# Patient Record
Sex: Female | Born: 1960 | Race: White | Hispanic: No | Marital: Single
Health system: Midwestern US, Community
[De-identification: ages and names within clinical notes are randomized; demographics above are authoritative.]

## PROBLEM LIST (undated history)

## (undated) DIAGNOSIS — N289 Disorder of kidney and ureter, unspecified: Secondary | ICD-10-CM

## (undated) DIAGNOSIS — M797 Fibromyalgia: Secondary | ICD-10-CM

## (undated) DIAGNOSIS — M810 Age-related osteoporosis without current pathological fracture: Secondary | ICD-10-CM

## (undated) DIAGNOSIS — F419 Anxiety disorder, unspecified: Secondary | ICD-10-CM

## (undated) DIAGNOSIS — N3010 Interstitial cystitis (chronic) without hematuria: Secondary | ICD-10-CM

## (undated) DIAGNOSIS — M549 Dorsalgia, unspecified: Secondary | ICD-10-CM

## (undated) DIAGNOSIS — G8929 Other chronic pain: Secondary | ICD-10-CM

## (undated) DIAGNOSIS — C801 Malignant (primary) neoplasm, unspecified: Secondary | ICD-10-CM

## (undated) DIAGNOSIS — K449 Diaphragmatic hernia without obstruction or gangrene: Secondary | ICD-10-CM

## (undated) DIAGNOSIS — G629 Polyneuropathy, unspecified: Secondary | ICD-10-CM

## (undated) HISTORY — PX: TRACHEOSTOMY: SUR1362

## (undated) HISTORY — PX: KNEE SURGERY: SHX244

## (undated) HISTORY — PX: TONSILLECTOMY: SUR1361

## (undated) HISTORY — PX: CHOLECYSTECTOMY: SHX55

## (undated) HISTORY — PX: TUBAL LIGATION: SHX77

## (undated) HISTORY — PX: ABDOMINAL HYSTERECTOMY: SHX81

## (undated) HISTORY — PX: APPENDECTOMY: SHX54

---

## 2010-06-30 LAB — MICROSCOPIC URINALYSIS: RBC, UA: 5 /HPF (ref 0–2)

## 2010-06-30 LAB — URINALYSIS
Bilirubin, Urine: NEGATIVE
Glucose, UA: NEGATIVE
Ketones, Urine: NEGATIVE
Nitrite, Urine: POSITIVE — AB
Specific Gravity, UA: 1.018 (ref 1.005–1.030)
Urobilinogen, Urine: NORMAL
pH, UA: 6 (ref 5.0–8.0)

## 2010-06-30 LAB — URINE CULTURE CLEAN CATCH

## 2010-08-01 LAB — C. TRACHOMATIS / N. GONORRHOEAE, DNA

## 2010-08-01 LAB — MICROSCOPIC URINALYSIS
RBC, UA: 0 /HPF (ref 0–2)
WBC, UA: 0 /HPF (ref 0–5)

## 2010-08-01 LAB — CBC WITH DIFFERENTIAL
Absolute Baso #: 0.1 10*3/uL (ref 0.0–0.2)
Absolute Eos #: 1 10*3/uL — ABNORMAL HIGH (ref 0.0–0.4)
Absolute Lymph #: 3.7 10*3/uL (ref 1.0–4.8)
Absolute Mono #: 0.5 10*3/uL (ref 0.1–1.2)
Absolute Neut #: 4.2 10*3/uL (ref 1.8–7.7)
Basophils: 1 % (ref 0–2)
Eosinophils %: 11 % — ABNORMAL HIGH (ref 1–4)
Hematocrit: 40.1 % (ref 36–46)
Hemoglobin: 13.9 g/dL (ref 12.0–16.0)
Lymphocytes: 39 % (ref 24–44)
MCH: 33 pg (ref 26–34)
MCHC: 34.6 g/dL (ref 31–37)
MCV: 95.6 fL (ref 80–100)
MPV: 8.1 fL (ref 6.0–12.0)
Monocytes: 5 % (ref 2–11)
Platelet Count: 272 10*3/uL (ref 140–450)
RBC: 4.2 m/uL (ref 4.0–5.2)
RDW: 14.6 % (ref 12.5–15.4)
Seg Neutrophils: 44 % (ref 36–66)
WBC: 9.5 10*3/uL (ref 3.5–11.0)

## 2010-08-01 LAB — LIPASE: Lipase: 25 U/L (ref 5.6–51.3)

## 2010-08-01 LAB — HEPATIC FUNCTION PANEL
ALT: 24 U/L (ref 4–40)
AST: 32 U/L (ref 8–36)
Albumin: 4.4 g/dL (ref 3.4–4.8)
Alkaline Phosphatase: 58 U/L (ref 25–100)
Bilirubin, Direct: 0.09 mg/dL (ref 0.0–0.3)
Bilirubin, Indirect: 0.11 mg/dL (ref 0.0–1.0)
Protein, Total: 7 g/dL (ref 6.4–8.3)
Total Bilirubin: 0.2 mg/dL — ABNORMAL LOW (ref 0.3–1.2)

## 2010-08-01 LAB — VAGINITIS DNA PROBE
Direct Exam: NEGATIVE
Direct Exam: NEGATIVE
Direct Exam: POSITIVE — AB

## 2010-08-01 LAB — BASIC METABOLIC PANEL
Anion Gap: 10 mmol/L (ref 8–16)
BUN: 16 mg/dL (ref 6–20)
CO2: 28 mmol/L (ref 20–31)
Calcium: 9.7 mg/dL (ref 8.6–10.4)
Chloride: 109 mmol/L (ref 98–110)
Creatinine: 0.71 mg/dL (ref 0.4–1.0)
GFR African American: >60 mL/min (ref >60)
GFR Non-African American: >60 mL/min (ref >60)
Glucose: 95 mg/dL (ref 74–106)
Potassium: 4.8 mmol/L (ref 3.5–5.1)
Sodium: 142 mmol/L (ref 136–145)

## 2010-08-01 LAB — APTT: PTT: 26.6 s (ref 20.3–31.1)

## 2010-08-01 LAB — DRUGS OF ABUSE SCR, URINE
Amphetamine Screen, Ur: NEGATIVE
Barbiturate Screen, Ur: NEGATIVE
Benzodiazepines: POSITIVE — AB
Cannabinoid Scrn, Ur: NEGATIVE
Cocaine Metabolite, Urine: POSITIVE — AB
Methadone Screen, Urine: NEGATIVE
Opiates, Urine: NEGATIVE
Phencyclidine, Urine: NEGATIVE
Propoxyphene, Urine: NEGATIVE

## 2010-08-01 LAB — URINALYSIS
Bilirubin, Urine: NEGATIVE
Glucose, UA: NEGATIVE
Ketones, Urine: NEGATIVE
Nitrite, Urine: NEGATIVE
Protein, UA: NEGATIVE
Specific Gravity, UA: 1.024 (ref 1.005–1.030)
Urine Hgb: NEGATIVE
Urobilinogen, Urine: NORMAL
pH, UA: 6 (ref 5.0–8.0)

## 2010-08-01 LAB — URINE CULTURE CLEAN CATCH

## 2010-08-01 LAB — PROTIME-INR
INR: 0.9
Prothrombin Time: 10.1 s (ref 9.6–12.2)

## 2010-08-01 LAB — AMYLASE: Amylase: 43 U/L (ref 20–104)

## 2010-08-01 LAB — T. PALLIDUM AB: T. pallidum, IgG: NONREACTIVE

## 2010-08-13 LAB — HEPATIC FUNCTION PANEL
ALT: 39 U/L (ref 4–40)
AST: 36 U/L (ref 8–36)
Albumin: 4.3 g/dL (ref 3.4–4.8)
Alkaline Phosphatase: 78 U/L (ref 25–100)
Bilirubin, Direct: 0.08 mg/dL (ref 0.0–0.3)
Bilirubin, Indirect: 0.17 mg/dL (ref 0.0–1.0)
Protein, Total: 7 g/dL (ref 6.4–8.3)
Total Bilirubin: 0.25 mg/dL — ABNORMAL LOW (ref 0.3–1.2)

## 2010-08-13 LAB — CHEST PAIN PNL (HR0)
% CKMB: 1 % (ref 0–4)
Absolute Baso #: 0.1 10*3/uL (ref 0.0–0.2)
Absolute Eos #: 0 10*3/uL (ref 0.0–0.4)
Absolute Lymph #: 1.4 10*3/uL (ref 1.0–4.8)
Absolute Mono #: 0.5 10*3/uL (ref 0.1–1.2)
Absolute Neut #: 7.7 10*3/uL (ref 1.8–7.7)
Anion Gap: 14 mmol/L (ref 8–16)
BUN: 16 mg/dL (ref 6–20)
Basophils: 1 % (ref 0–2)
CK-MB: 1.7 ng/mL (ref 0–5)
CO2: 28 mmol/L (ref 20–31)
Calcium: 9.6 mg/dL (ref 8.6–10.4)
Chloride: 103 mmol/L (ref 98–110)
Creatinine: 0.69 mg/dL (ref 0.4–1.0)
Eosinophils %: 0 % — ABNORMAL LOW (ref 1–4)
GFR African American: >60 mL/min (ref >60)
GFR Non-African American: >60 mL/min (ref >60)
Glucose: 116 mg/dL — ABNORMAL HIGH (ref 74–106)
Hematocrit: 37.8 % (ref 36–46)
Hemoglobin: 12.9 g/dL (ref 12.0–16.0)
Lymphocytes: 15 % — ABNORMAL LOW (ref 24–44)
MCH: 32.4 pg (ref 26–34)
MCHC: 34 g/dL (ref 31–37)
MCV: 95.3 fL (ref 80–100)
MPV: 7.9 fL (ref 6.0–12.0)
Monocytes: 5 % (ref 2–11)
Myoglobin: 30 ng/mL (ref 0–110)
Platelet Count: 239 10*3/uL (ref 140–450)
Potassium: 3.7 mmol/L (ref 3.5–5.1)
RBC: 3.96 m/uL — ABNORMAL LOW (ref 4.0–5.2)
RDW: 14.6 % (ref 12.5–15.4)
Seg Neutrophils: 79 % — ABNORMAL HIGH (ref 36–66)
Sodium: 141 mmol/L (ref 136–145)
Total CK: 162 U/L — ABNORMAL HIGH (ref 26–140)
Troponin I: 0.02 ng/mL (ref 0.00–0.04)
WBC: 9.8 10*3/uL (ref 3.5–11.0)

## 2010-08-13 LAB — TROPONIN
Troponin I: 0.02 ng/mL (ref 0.00–0.04)
Troponin I: 0.02 ng/mL (ref 0.00–0.04)

## 2010-08-13 LAB — LIPASE: Lipase: 19 U/L (ref 5.6–51.3)

## 2010-08-13 LAB — MYOGLOBIN, SERUM: Myoglobin: 33 ng/mL (ref 0–110)

## 2010-08-13 LAB — D-DIMER, QUANTITATIVE: D-Dimer, Quant: 1.03 mg/L FEU

## 2010-08-14 LAB — TROP/MYOGLOBIN
Myoglobin: 22 ng/mL (ref 0–110)
Troponin I: 0.01 ng/mL (ref 0.00–0.04)

## 2010-08-14 LAB — POC GLUCOSE FINGERSTICK
Glucose, Whole Blood: 105 mg/dL (ref 65–105)
Glucose, Whole Blood: 93 mg/dL (ref 65–105)
Glucose, Whole Blood: 118 mg/dL — ABNORMAL HIGH (ref 65–105)

## 2011-12-26 ENCOUNTER — Encounter (HOSPITAL_BASED_OUTPATIENT_CLINIC_OR_DEPARTMENT_OTHER): Payer: Self-pay | Admitting: *Deleted

## 2011-12-26 ENCOUNTER — Emergency Department (HOSPITAL_BASED_OUTPATIENT_CLINIC_OR_DEPARTMENT_OTHER)
Admission: EM | Admit: 2011-12-26 | Discharge: 2011-12-26 | Disposition: A | Discharge status: Home / Self Care | Payer: Medicare Other | Attending: Emergency Medicine | Admitting: Emergency Medicine

## 2011-12-26 DIAGNOSIS — M546 Pain in thoracic spine: Secondary | ICD-10-CM | POA: Insufficient documentation

## 2011-12-26 DIAGNOSIS — F1120 Opioid dependence, uncomplicated: Secondary | ICD-10-CM | POA: Insufficient documentation

## 2011-12-26 DIAGNOSIS — F17200 Nicotine dependence, unspecified, uncomplicated: Secondary | ICD-10-CM | POA: Insufficient documentation

## 2011-12-26 DIAGNOSIS — M069 Rheumatoid arthritis, unspecified: Secondary | ICD-10-CM | POA: Insufficient documentation

## 2011-12-26 DIAGNOSIS — M545 Low back pain, unspecified: Secondary | ICD-10-CM | POA: Insufficient documentation

## 2011-12-26 DIAGNOSIS — M79609 Pain in unspecified limb: Secondary | ICD-10-CM | POA: Insufficient documentation

## 2011-12-26 DIAGNOSIS — E119 Type 2 diabetes mellitus without complications: Secondary | ICD-10-CM | POA: Insufficient documentation

## 2011-12-26 HISTORY — DX: Malignant (primary) neoplasm, unspecified: C80.1

## 2011-12-26 HISTORY — DX: Anxiety disorder, unspecified: F41.9

## 2011-12-26 HISTORY — DX: Disorder of kidney and ureter, unspecified: N28.9

## 2011-12-26 MED ORDER — LORAZEPAM 1 MG PO TABS
1.0000 mg | ORAL_TABLET | Freq: Once | ORAL | Status: AC
  Administered 2011-12-26: 1 mg via ORAL
  Filled 2011-12-26: qty 1

## 2011-12-26 MED ORDER — HYDROMORPHONE HCL PF 2 MG/ML IJ SOLN
2.0000 mg | Freq: Once | INTRAMUSCULAR | Status: AC
  Administered 2011-12-26: 2 mg via INTRAMUSCULAR
  Filled 2011-12-26: qty 1

## 2011-12-26 MED ORDER — ONDANSETRON HCL 4 MG/2ML IJ SOLN: 4.0000 mg | Freq: Once | INTRAMUSCULAR | Status: DC

## 2011-12-26 NOTE — ED Notes (Signed)
Back and leg pain chronic has been on high dose pain meds recently moved here

## 2011-12-26 NOTE — ED Provider Notes (Signed)
History     CSN: [id number]  Arrival date & time 12/26/11  1058   First MD Initiated Contact with Patient 12/26/11 1227      Chief Complaint  Patient presents with  . Back Pain  . Leg Pain    (Consider location/radiation/quality/duration/timing/severity/associated sxs/prior treatment) Patient is a 52 y.o. female presenting with back pain. The history is provided by the patient. No language interpreter was used.  Back Pain  This is a chronic problem. Episode onset: 10 years. The problem occurs constantly. The problem has been gradually worsening. The pain is associated with no known injury. The pain is present in the lumbar spine and thoracic spine. The quality of the pain is described as aching. The pain is at a severity of 9/10. The pain is severe. The symptoms are aggravated by bending and twisting. The pain is the same all the time. Stiffness is present all day. She has tried nothing for the symptoms.  Pt complains of chronic pain. Pt reports she has been on oxy 30 and dilaudid 2 times a day.  Pt has been on multiple pain medications for the past 10 years. Pt saw Dr. Edmon Crape and is trying to get into pain management. And the methadone clinic.  Past Medical History  Diagnosis Date  . Arthritis   . Anxiety   . Renal disorder   . Rheumatoid arthritis   . Diabetes mellitus   . Cancer     History reviewed. No pertinent past surgical history.  History reviewed. No pertinent family history.  History  Substance Use Topics  . Smoking status: Current Everyday Smoker  . Smokeless tobacco: Not on file  . Alcohol Use: Yes     occ    OB History    Grav Para Term Preterm Abortions TAB SAB Ect Mult Living                  Review of Systems  Musculoskeletal: Positive for back pain.  All other systems reviewed and are negative.    Allergies  Metformin and related; Seroquel; Trazodone and nefazodone; and Zofran  Home Medications  No current outpatient prescriptions on  file.  BP 125/96  Pulse 92  Temp(Src) 98 F (36.7 C) (Oral)  Resp 20  SpO2 99%  Physical Exam  Vitals reviewed. Constitutional: She is oriented to person, place, and time. She appears well-developed and well-nourished.  HENT:  Head: Normocephalic and atraumatic.  Eyes: Conjunctivae are normal. Pupils are equal, round, and reactive to light.  Neck: Normal range of motion. Neck supple.  Cardiovascular: Normal rate.   Pulmonary/Chest: Effort normal.  Abdominal: Soft.  Musculoskeletal: Normal range of motion.  Neurological: She is alert and oriented to person, place, and time. She has normal reflexes.  Skin: Skin is warm.  Psychiatric: She has a normal mood and affect.    ED Course  Procedures (including critical care time)  Labs Reviewed - No data to display No results found.   No diagnosis found.    MDM  Pt is not showing any sign of withdrawal.  I advised her this/ED will not manage the kind of medications she request.         Elson Areas, PA 12/26/11 1303

## 2011-12-26 NOTE — Discharge Instructions (Signed)

## 2011-12-26 NOTE — ED Provider Notes (Signed)
Medical screening examination/treatment/procedure(s) were performed by non-physician practitioner and as supervising physician I was immediately available for consultation/collaboration.   Sidda Humm, MD 12/26/11 1515 

## 2012-03-27 ENCOUNTER — Emergency Department (HOSPITAL_BASED_OUTPATIENT_CLINIC_OR_DEPARTMENT_OTHER)
Admission: EM | Admit: 2012-03-27 | Discharge: 2012-03-27 | Disposition: A | Discharge status: Home / Self Care | Payer: Medicare Other | Attending: Emergency Medicine | Admitting: Emergency Medicine

## 2012-03-27 ENCOUNTER — Encounter (HOSPITAL_BASED_OUTPATIENT_CLINIC_OR_DEPARTMENT_OTHER): Payer: Self-pay | Admitting: Family Medicine

## 2012-03-27 DIAGNOSIS — E119 Type 2 diabetes mellitus without complications: Secondary | ICD-10-CM | POA: Insufficient documentation

## 2012-03-27 DIAGNOSIS — Z90710 Acquired absence of both cervix and uterus: Secondary | ICD-10-CM | POA: Insufficient documentation

## 2012-03-27 DIAGNOSIS — F17200 Nicotine dependence, unspecified, uncomplicated: Secondary | ICD-10-CM | POA: Insufficient documentation

## 2012-03-27 DIAGNOSIS — M129 Arthropathy, unspecified: Secondary | ICD-10-CM | POA: Insufficient documentation

## 2012-03-27 DIAGNOSIS — IMO0001 Reserved for inherently not codable concepts without codable children: Secondary | ICD-10-CM | POA: Insufficient documentation

## 2012-03-27 DIAGNOSIS — M069 Rheumatoid arthritis, unspecified: Secondary | ICD-10-CM | POA: Insufficient documentation

## 2012-03-27 DIAGNOSIS — Z9089 Acquired absence of other organs: Secondary | ICD-10-CM | POA: Insufficient documentation

## 2012-03-27 DIAGNOSIS — G589 Mononeuropathy, unspecified: Secondary | ICD-10-CM | POA: Insufficient documentation

## 2012-03-27 DIAGNOSIS — F411 Generalized anxiety disorder: Secondary | ICD-10-CM | POA: Insufficient documentation

## 2012-03-27 DIAGNOSIS — R51 Headache: Secondary | ICD-10-CM | POA: Insufficient documentation

## 2012-03-27 HISTORY — DX: Fibromyalgia: M79.7

## 2012-03-27 HISTORY — DX: Polyneuropathy, unspecified: G62.9

## 2012-03-27 HISTORY — DX: Diaphragmatic hernia without obstruction or gangrene: K44.9

## 2012-03-27 MED ORDER — PROMETHAZINE HCL 25 MG RE SUPP
25.0000 mg | Freq: Four times a day (QID) | RECTAL | Status: DC | PRN
  Filled 2012-03-27: qty 1

## 2012-03-27 MED ORDER — PROMETHAZINE HCL 25 MG/ML IJ SOLN
25.0000 mg | Freq: Once | INTRAMUSCULAR | Status: AC
  Administered 2012-03-27: 25 mg via INTRAMUSCULAR
  Filled 2012-03-27: qty 1

## 2012-03-27 MED ORDER — HYDROMORPHONE HCL PF 2 MG/ML IJ SOLN
2.0000 mg | Freq: Once | INTRAMUSCULAR | Status: AC
  Administered 2012-03-27: 2 mg via INTRAMUSCULAR
  Filled 2012-03-27: qty 1

## 2012-03-27 NOTE — ED Notes (Signed)
Pt c/o migraine with nausea x 3 days. Pt taking tylenol and benadryl and phenergan for same. Pt sts she has had migraines for "20 years".

## 2012-03-27 NOTE — ED Provider Notes (Signed)
History     CSN: [id number]  Arrival date & time 03/27/12  1238   First MD Initiated Contact with Patient 03/27/12 1252      Chief Complaint  Patient presents with  . Migraine    (Consider location/radiation/quality/duration/timing/severity/associated sxs/prior treatment) Patient is a 51 y.o. female presenting with headaches. The history is provided by the patient. No language interpreter was used.  Headache  This is a new problem. The current episode started more than 2 days ago. The problem occurs constantly. The headache is associated with nothing. The pain is located in the frontal region. The quality of the pain is described as sharp. The pain is at a severity of 7/10. The pain is severe. Associated symptoms include nausea. She has tried nothing for the symptoms.   Pt complains of a headache.   Pt request injection of dilaudid and phenergan.   Pt reports there area a lot of medicines she will not take.  Pt reports she can take oxycodone and dilaudid Past Medical History  Diagnosis Date  . Arthritis   . Anxiety   . Renal disorder   . Rheumatoid arthritis   . Diabetes mellitus   . Cancer   . Fibromyalgia   . Neuropathy   . Hiatal hernia     Past Surgical History  Procedure Date  . Tonsillectomy   . Abdominal hysterectomy   . Cholecystectomy   . Tubal ligation   . Tracheostomy     No family history on file.  History  Substance Use Topics  . Smoking status: Current Everyday Smoker  . Smokeless tobacco: Not on file  . Alcohol Use: Yes     occ    OB History    Grav Para Term Preterm Abortions TAB SAB Ect Mult Living                  Review of Systems  Gastrointestinal: Positive for nausea.  Neurological: Positive for headaches.  All other systems reviewed and are negative.    Allergies  Metformin and related; Reglan; Seroquel; Trazodone and nefazodone; Unasyn; and Zofran  Home Medications   Current Outpatient Rx  Name Route Sig Dispense Refill  .  ACETAMINOPHEN 325 MG PO TABS Oral Take 650 mg by mouth every 6 (six) hours as needed.    Marland Kitchen GABAPENTIN 600 MG PO TABS Oral Take 600 mg by mouth 3 (three) times daily.    Marland Kitchen LORAZEPAM 1 MG PO TABS Oral Take 1 mg by mouth every 8 (eight) hours as needed.    . EFFEXOR PO Oral Take 250 mg by mouth 2 (two) times daily.      BP 126/98  Pulse 112  Temp 98.3 F (36.8 C) (Oral)  Resp 18  Ht 5' (1.524 m)  Wt 162 lb (73.483 kg)  BMI 31.64 kg/m2  SpO2 100%  Physical Exam  Nursing note and vitals reviewed. Constitutional: She is oriented to person, place, and time. She appears well-developed and well-nourished.  HENT:  Head: Normocephalic and atraumatic.  Eyes: Conjunctivae and EOM are normal. Pupils are equal, round, and reactive to light.  Cardiovascular: Normal rate and normal heart sounds.   Pulmonary/Chest: Effort normal.  Abdominal: Soft.  Musculoskeletal: Normal range of motion.  Neurological: She is alert and oriented to person, place, and time.  Skin: Skin is warm.  Psychiatric: She has a normal mood and affect.    ED Course  Procedures (including critical care time)  Labs Reviewed - No data to display  No results found.   1. Headache       MDM  Dilaudid and phenergan IM        Lonia Skinner [place], PA 03/27/12 1347  Lonia Skinner [place], [place] 03/27/12 1359

## 2012-03-27 NOTE — ED Provider Notes (Signed)
Medical screening examination/treatment/procedure(s) were performed by non-physician practitioner and as supervising physician I was immediately available for consultation/collaboration.   Forbes Cellar, MD 03/27/12 [phone]

## 2012-06-04 ENCOUNTER — Emergency Department (HOSPITAL_BASED_OUTPATIENT_CLINIC_OR_DEPARTMENT_OTHER)
Admission: EM | Admit: 2012-06-04 | Discharge: 2012-06-04 | Disposition: A | Discharge status: Home / Self Care | Payer: Medicare Other | Attending: Emergency Medicine | Admitting: Emergency Medicine

## 2012-06-04 ENCOUNTER — Encounter (HOSPITAL_BASED_OUTPATIENT_CLINIC_OR_DEPARTMENT_OTHER): Payer: Self-pay

## 2012-06-04 ENCOUNTER — Emergency Department (HOSPITAL_BASED_OUTPATIENT_CLINIC_OR_DEPARTMENT_OTHER): Payer: Medicare Other

## 2012-06-04 DIAGNOSIS — N12 Tubulo-interstitial nephritis, not specified as acute or chronic: Secondary | ICD-10-CM

## 2012-06-04 DIAGNOSIS — E119 Type 2 diabetes mellitus without complications: Secondary | ICD-10-CM | POA: Insufficient documentation

## 2012-06-04 DIAGNOSIS — R509 Fever, unspecified: Secondary | ICD-10-CM | POA: Insufficient documentation

## 2012-06-04 DIAGNOSIS — R109 Unspecified abdominal pain: Secondary | ICD-10-CM | POA: Insufficient documentation

## 2012-06-04 DIAGNOSIS — Z79899 Other long term (current) drug therapy: Secondary | ICD-10-CM | POA: Insufficient documentation

## 2012-06-04 DIAGNOSIS — I2510 Atherosclerotic heart disease of native coronary artery without angina pectoris: Secondary | ICD-10-CM | POA: Insufficient documentation

## 2012-06-04 DIAGNOSIS — Z9089 Acquired absence of other organs: Secondary | ICD-10-CM | POA: Insufficient documentation

## 2012-06-04 HISTORY — DX: Age-related osteoporosis without current pathological fracture: M81.0

## 2012-06-04 LAB — COMPREHENSIVE METABOLIC PANEL
ALT: 26 U/L (ref 0–35)
AST: 18 U/L (ref 0–37)
Alkaline Phosphatase: 80 U/L (ref 39–117)
CO2: 26 mEq/L (ref 19–32)
Chloride: 99 mEq/L (ref 96–112)
GFR calc non Af Amer: >90 mL/min (ref >90)
Glucose, Bld: 116 mg/dL — ABNORMAL HIGH (ref 70–99)
Potassium: 4 mEq/L (ref 3.5–5.1)
Sodium: 136 mEq/L (ref 135–145)
Total Bilirubin: 0.3 mg/dL (ref 0.3–1.2)

## 2012-06-04 LAB — CBC WITH DIFFERENTIAL/PLATELET
Basophils Absolute: 0 10*3/uL (ref 0.0–0.1)
HCT: 39.5 % (ref 36.0–46.0)
Lymphocytes Relative: 14 % (ref 12–46)
Lymphs Abs: 1.8 10*3/uL (ref 0.7–4.0)
Neutro Abs: 9.4 10*3/uL — ABNORMAL HIGH (ref 1.7–7.7)
Platelets: 299 10*3/uL (ref 150–400)
RBC: 4.13 MIL/uL (ref 3.87–5.11)
RDW: 13.7 % (ref 11.5–15.5)
WBC: 12.4 10*3/uL — ABNORMAL HIGH (ref 4.0–10.5)

## 2012-06-04 LAB — URINALYSIS, ROUTINE W REFLEX MICROSCOPIC
Bilirubin Urine: NEGATIVE
Specific Gravity, Urine: 1.012 (ref 1.005–1.030)
Urobilinogen, UA: 1 mg/dL (ref 0.0–1.0)
pH: 6 (ref 5.0–8.0)

## 2012-06-04 LAB — URINE MICROSCOPIC-ADD ON

## 2012-06-04 MED ORDER — PROMETHAZINE HCL 25 MG PO TABS: 25.0000 mg | ORAL_TABLET | Freq: Four times a day (QID) | ORAL | Status: DC | PRN

## 2012-06-04 MED ORDER — SODIUM CHLORIDE 0.9 % IV BOLUS (SEPSIS)
1000.0000 mL | Freq: Once | INTRAVENOUS | Status: AC
  Administered 2012-06-04: 1000 mL via INTRAVENOUS

## 2012-06-04 MED ORDER — ACETAMINOPHEN 500 MG PO TABS
1000.0000 mg | ORAL_TABLET | Freq: Once | ORAL | Status: AC
  Administered 2012-06-04: 1000 mg via ORAL
  Filled 2012-06-04: qty 2

## 2012-06-04 MED ORDER — MORPHINE SULFATE 4 MG/ML IJ SOLN
INTRAMUSCULAR | Status: AC
  Filled 2012-06-04: qty 1

## 2012-06-04 MED ORDER — MORPHINE SULFATE 4 MG/ML IJ SOLN
4.0000 mg | Freq: Once | INTRAMUSCULAR | Status: AC
  Administered 2012-06-04: 4 mg via INTRAVENOUS
  Filled 2012-06-04: qty 1

## 2012-06-04 MED ORDER — PROMETHAZINE HCL 25 MG/ML IJ SOLN
25.0000 mg | Freq: Once | INTRAMUSCULAR | Status: AC
  Administered 2012-06-04: 25 mg via INTRAVENOUS
  Filled 2012-06-04: qty 1

## 2012-06-04 MED ORDER — OXYCODONE-ACETAMINOPHEN 5-325 MG PO TABS: 2.0000 | ORAL_TABLET | ORAL | Status: DC | PRN

## 2012-06-04 MED ORDER — DEXTROSE 5 % IV SOLN
1.0000 g | Freq: Once | INTRAVENOUS | Status: AC
  Administered 2012-06-04: 1 g via INTRAVENOUS
  Filled 2012-06-04: qty 10

## 2012-06-04 MED ORDER — CIPROFLOXACIN HCL 500 MG PO TABS: 500.0000 mg | ORAL_TABLET | Freq: Two times a day (BID) | ORAL | Status: DC

## 2012-06-04 NOTE — ED Provider Notes (Signed)
History     CSN: [id number]  Arrival date & time 06/04/12  1543   First MD Initiated Contact with Patient 06/04/12 1612      Chief Complaint  Patient presents with  . Urinary Frequency    (Consider location/radiation/quality/duration/timing/severity/associated sxs/prior treatment) HPI Comments: Patient presents with right flank pain for the past 3 days. The pain started gradually and is progressively worsening over the past 3 days. The pain is cramping and severe. She denies aggravating/alleviating factors. She reports a history of kidney stones and pyelonephritis which felt like this. She reports associated urinary frequency, nausea and fever. She denies dysuria, abdominal pain, hematuria, vomiting, diarrhea.    Past Medical History  Diagnosis Date  . Arthritis   . Anxiety   . Renal disorder   . Rheumatoid arthritis   . Diabetes mellitus   . Cancer   . Fibromyalgia   . Neuropathy   . Hiatal hernia   . Osteoporosis     Past Surgical History  Procedure Date  . Tonsillectomy   . Abdominal hysterectomy   . Cholecystectomy   . Tubal ligation   . Tracheostomy   . Cesarean section   . Knee surgery     No family history on file.  History  Substance Use Topics  . Smoking status: Current Every Day Smoker  . Smokeless tobacco: Not on file  . Alcohol Use: No    OB History    Grav Para Term Preterm Abortions TAB SAB Ect Mult Living                  Review of Systems  Constitutional: Positive for fever.  Gastrointestinal: Positive for nausea.  Genitourinary: Positive for flank pain.  All other systems reviewed and are negative.    Allergies  Aspirin; Ibuprofen; Metformin and related; Reglan; Seroquel; Trazodone and nefazodone; Unasyn; and Zofran  Home Medications   Current Outpatient Rx  Name Route Sig Dispense Refill  . XANAX PO Oral Take by mouth.    . ACETAMINOPHEN 325 MG PO TABS Oral Take 650 mg by mouth every 6 (six) hours as needed.    Marland Kitchen GABAPENTIN  600 MG PO TABS Oral Take 600 mg by mouth 3 (three) times daily.    Marland Kitchen LORAZEPAM 1 MG PO TABS Oral Take 1 mg by mouth every 8 (eight) hours as needed.    . EFFEXOR PO Oral Take 250 mg by mouth 2 (two) times daily.      BP 104/92  Pulse 114  Temp 99.9 F (37.7 C) (Oral)  Resp 16  SpO2 99%  Physical Exam  Nursing note and vitals reviewed. Constitutional: She is oriented to person, place, and time. She appears well-developed and well-nourished. No distress.  HENT:  Head: Normocephalic and atraumatic.  Eyes: Conjunctivae normal and EOM are normal. No scleral icterus.  Neck: Normal range of motion. Neck supple.  Cardiovascular: Normal rate and regular rhythm.  Exam reveals no gallop and no friction rub.   No murmur heard. Pulmonary/Chest: Effort normal and breath sounds normal. No respiratory distress. She has no wheezes. She has no rales. She exhibits no tenderness.  Abdominal: Soft. She exhibits no distension. There is no rebound and no guarding.       RUQ tenderness to palpation. No suprapubic tenderness noted.   Genitourinary:       CVA tenderness noted of right flank.   Musculoskeletal: Normal range of motion.  Neurological: She is alert and oriented to person, place, and time. Coordination  normal.  Skin: Skin is warm and dry. She is not diaphoretic.  Psychiatric: She has a normal mood and affect. Her behavior is normal.    ED Course  Procedures (including critical care time)   Date: 06/04/2012  Rate: 108  Rhythm: sinus tachycardia  QRS Axis: normal  Intervals: normal  ST/T Wave abnormalities: normal  Conduction Disutrbances:none  Narrative Interpretation: sinus tachycardia  Old EKG Reviewed: none available    Labs Reviewed  URINALYSIS, ROUTINE W REFLEX MICROSCOPIC - Abnormal; Notable for the following:    APPearance TURBID (*)     Hgb urine dipstick LARGE (*)     Protein, ur 30 (*)     Nitrite POSITIVE (*)     Leukocytes, UA LARGE (*)     All other components  within normal limits  URINE MICROSCOPIC-ADD ON - Abnormal; Notable for the following:    Bacteria, UA MANY (*)     All other components within normal limits  CBC WITH DIFFERENTIAL - Abnormal; Notable for the following:    WBC 12.4 (*)     Neutro Abs 9.4 (*)     All other components within normal limits  COMPREHENSIVE METABOLIC PANEL - Abnormal; Notable for the following:    Glucose, Bld 116 (*)     All other components within normal limits  URINE CULTURE   Ct Abdomen Pelvis Wo Contrast  06/04/2012  *RADIOLOGY REPORT*  Clinical Data:  Urinary frequency.  Pressure.  Fever.  Right flank pain for 3 days.  History of arthritis, anxiety, renal disorder, rheumatoid arthritis.  Diabetes.  Hiatal hernia.  Osteoporosis. Appendectomy.  CT ABDOMEN AND PELVIS WITHOUT CONTRAST (CT UROGRAM)  Technique: Contiguous axial images of the abdomen and pelvis without oral or intravenous contrast were obtained.  Comparison: None  Findings:  Exam is limited for evaluation of entities other than urinary tract calculi due to lack of oral or intravenous contrast.   Volume loss in the lingula.  Heart size upper normal, without pericardial or pleural effusion.  There is age advanced coronary artery atherosclerosis including on the right image 1.  Normal spleen.  A small hiatal hernia.  Normal stomach, pancreas.  The liver is enlarged at 21.2 cm cranial caudal.  Mild intrahepatic biliary ductal dilatation is identified.  Status post cholecystectomy.  The common duct measures 1.6 cm on image 26 in the porta hepatis, mildly dilated.  No obstructive stone or mass is identified.  Normal adrenal glands. No renal calculi or hydronephrosis.  There is equivocal right perirenal edema, including on image 34. The right ureter is minimally prominent relative to the left.  No stones identified. Small retroperitoneal nodes, without adenopathy.  Normal colon and terminal ileum.  Appendix surgically absent by history.  Normal small bowel without  abdominal ascites.  Age advanced aortic and branch vessel atherosclerosis.  No pelvic adenopathy.  Hysterectomy.  Normal urinary bladder.  No adnexal mass or significant free fluid.  Injection granulomas superficial the gluteal muscles bilaterally.  Mild osteopenia.  IMPRESSION:  1.  No urinary tract calculi.  Subtle right perirenal edema and ureteric prominence suspected.  This could represent prior stone passage or pyelonephritis. 2.  Cholecystectomy.  Mild biliary ductal dilatation.  Consider correlation with right upper quadrant symptoms and liver function tests.  If these are abnormal, non emergent MRCP should be considered. 3. Age advanced coronary artery atherosclerosis.  Recommend assessment of coronary risk factors and consideration of medical therapy.   Original Report Authenticated By: Consuello Bossier, M.D.  1. Pyelonephritis       MDM  8:52 PM Patient given 2L fluid, morphine, phenergan. She was treated for pyelonephritis with 1g Rocephin. Her CT scan shows no calculi. Labs show elevated WBC. Urinalysis shows infection.   Patient's pain and nausea is currently controlled. Repeat exam shows mild tenderness to palpation of RUQ. She is afebrile and heartrate has been reduced from 130's. I will discharge her with a 14 day course of Cipro, percocet for pain, and phenergan for nausea. Patient agrees to return to the ED with worsening or concerning symptoms.         Emilia Beck, PA-C 06/04/12 2120

## 2012-06-04 NOTE — ED Notes (Signed)
Pt given ice chips per kaitlyn, pa.

## 2012-06-04 NOTE — ED Notes (Signed)
C/o urinary freg, pressure, fever, right flank pain x 3 days

## 2012-06-05 NOTE — ED Provider Notes (Signed)
Medical screening examination/treatment/procedure(s) were performed by non-physician practitioner and as supervising physician I was immediately available for consultation/collaboration.  Derwood Kaplan, MD 06/05/12 [phone]

## 2012-06-06 LAB — URINE CULTURE

## 2012-06-07 NOTE — ED Notes (Signed)
+   Urine Patient treated with Cipro-sensitive to same-chart appended per protocol MD. 

## 2012-06-10 ENCOUNTER — Ambulatory Visit: Payer: Self-pay | Admitting: Pain Medicine

## 2012-07-22 ENCOUNTER — Emergency Department (HOSPITAL_BASED_OUTPATIENT_CLINIC_OR_DEPARTMENT_OTHER): Payer: Medicare Other

## 2012-07-22 ENCOUNTER — Encounter (HOSPITAL_BASED_OUTPATIENT_CLINIC_OR_DEPARTMENT_OTHER): Payer: Self-pay | Admitting: Family Medicine

## 2012-07-22 ENCOUNTER — Emergency Department (HOSPITAL_BASED_OUTPATIENT_CLINIC_OR_DEPARTMENT_OTHER)
Admission: EM | Admit: 2012-07-22 | Discharge: 2012-07-22 | Disposition: A | Discharge status: Home / Self Care | Payer: Medicare Other | Attending: Emergency Medicine | Admitting: Emergency Medicine

## 2012-07-22 DIAGNOSIS — K5900 Constipation, unspecified: Secondary | ICD-10-CM | POA: Insufficient documentation

## 2012-07-22 DIAGNOSIS — F411 Generalized anxiety disorder: Secondary | ICD-10-CM | POA: Insufficient documentation

## 2012-07-22 DIAGNOSIS — F17200 Nicotine dependence, unspecified, uncomplicated: Secondary | ICD-10-CM | POA: Insufficient documentation

## 2012-07-22 DIAGNOSIS — E119 Type 2 diabetes mellitus without complications: Secondary | ICD-10-CM | POA: Insufficient documentation

## 2012-07-22 DIAGNOSIS — Z79899 Other long term (current) drug therapy: Secondary | ICD-10-CM | POA: Insufficient documentation

## 2012-07-22 DIAGNOSIS — R109 Unspecified abdominal pain: Secondary | ICD-10-CM

## 2012-07-22 DIAGNOSIS — N390 Urinary tract infection, site not specified: Secondary | ICD-10-CM | POA: Insufficient documentation

## 2012-07-22 LAB — URINALYSIS, ROUTINE W REFLEX MICROSCOPIC
Bilirubin Urine: NEGATIVE
Ketones, ur: NEGATIVE mg/dL
Protein, ur: NEGATIVE mg/dL
Specific Gravity, Urine: 1.013 (ref 1.005–1.030)
Urobilinogen, UA: 0.2 mg/dL (ref 0.0–1.0)

## 2012-07-22 LAB — CBC WITH DIFFERENTIAL/PLATELET
Basophils Relative: 0 % (ref 0–1)
Eosinophils Relative: 8 % — ABNORMAL HIGH (ref 0–5)
HCT: 35 % — ABNORMAL LOW (ref 36.0–46.0)
Hemoglobin: 11.4 g/dL — ABNORMAL LOW (ref 12.0–15.0)
MCH: 32 pg (ref 26.0–34.0)
Monocytes Absolute: 0.6 10*3/uL (ref 0.1–1.0)
Neutrophils Relative %: 52 % (ref 43–77)
RBC: 3.56 MIL/uL — ABNORMAL LOW (ref 3.87–5.11)

## 2012-07-22 LAB — URINE MICROSCOPIC-ADD ON

## 2012-07-22 LAB — BASIC METABOLIC PANEL
BUN: 14 mg/dL (ref 6–23)
Chloride: 101 mEq/L (ref 96–112)
GFR calc non Af Amer: 85 mL/min — ABNORMAL LOW (ref >90)
Glucose, Bld: 107 mg/dL — ABNORMAL HIGH (ref 70–99)
Potassium: 3.9 mEq/L (ref 3.5–5.1)

## 2012-07-22 MED ORDER — OXYCODONE-ACETAMINOPHEN 5-325 MG PO TABS
2.0000 | ORAL_TABLET | Freq: Once | ORAL | Status: AC
  Administered 2012-07-22: 2 via ORAL
  Filled 2012-07-22 (×2): qty 2

## 2012-07-22 MED ORDER — DISPOSABLE ENEMA 19-7 GM/118ML RE ENEM: 1.0000 | ENEMA | Freq: Once | RECTAL | Status: DC

## 2012-07-22 MED ORDER — OXYCODONE-ACETAMINOPHEN 5-325 MG PO TABS: 1.0000 | ORAL_TABLET | Freq: Four times a day (QID) | ORAL | Status: DC | PRN

## 2012-07-22 MED ORDER — METHOCARBAMOL 500 MG PO TABS
500.0000 mg | ORAL_TABLET | Freq: Once | ORAL | Status: AC
  Administered 2012-07-22: 500 mg via ORAL
  Filled 2012-07-22: qty 1

## 2012-07-22 MED ORDER — POLYETHYLENE GLYCOL 3350 17 GM/SCOOP PO POWD: 17.0000 g | Freq: Two times a day (BID) | ORAL | Status: DC

## 2012-07-22 MED ORDER — METHOCARBAMOL 500 MG PO TABS: 500.0000 mg | ORAL_TABLET | Freq: Two times a day (BID) | ORAL | Status: DC

## 2012-07-22 MED ORDER — DOCUSATE SODIUM 100 MG PO CAPS: 100.0000 mg | ORAL_CAPSULE | Freq: Two times a day (BID) | ORAL | Status: DC

## 2012-07-22 MED ORDER — HYDROMORPHONE HCL PF 2 MG/ML IJ SOLN
2.0000 mg | Freq: Once | INTRAMUSCULAR | Status: AC
  Administered 2012-07-22: 2 mg via INTRAMUSCULAR
  Filled 2012-07-22: qty 1

## 2012-07-22 NOTE — ED Notes (Signed)
Pt. Reports she is going to pain management in a couple of wks.  Pt. Reports she was  Size 3 in March after coming off her benzos and narcotics.

## 2012-07-22 NOTE — ED Provider Notes (Signed)
History     CSN: [id number]  Arrival date & time 07/22/12  1415   First MD Initiated Contact with Patient 07/22/12 1441      Chief Complaint  Patient presents with  . Urinary Tract Infection    (Consider location/radiation/quality/duration/timing/severity/associated sxs/prior treatment) HPI Comments: Patient presents with complaint of bilateral flank pain 10/10 for three weeks. She states that she was treated for a kidney infection 1 month ago, but feels like it never "cleared up". Patient also complaint of left knee pain that began after lifting a heavy pitcher 3 days ago. Of note the patient has RA and thinks that pain may be related to a flare. Denies fever or chills. Denies dysuria, urgency, frequency. Denies NVD or abdominal pain.  The history is provided by the patient. No language interpreter was used.    Past Medical History  Diagnosis Date  . Arthritis   . Anxiety   . Renal disorder   . Rheumatoid arthritis   . Diabetes mellitus   . Cancer   . Fibromyalgia   . Neuropathy   . Hiatal hernia   . Osteoporosis     Past Surgical History  Procedure Date  . Tonsillectomy   . Abdominal hysterectomy   . Cholecystectomy   . Tubal ligation   . Tracheostomy   . Cesarean section   . Knee surgery     No family history on file.  History  Substance Use Topics  . Smoking status: Current Every Day Smoker  . Smokeless tobacco: Not on file  . Alcohol Use: No    OB History    Grav Para Term Preterm Abortions TAB SAB Ect Mult Living                  Review of Systems  Constitutional: Negative for fever and chills.  HENT: Negative for neck pain.   Gastrointestinal: Negative for nausea, vomiting, abdominal pain and diarrhea.  Genitourinary: Positive for flank pain. Negative for dysuria, urgency, frequency and hematuria.  Musculoskeletal: Positive for arthralgias.  Skin: Negative for rash.  Neurological: Negative for headaches.  Hematological: Does not bruise/bleed  easily.    Allergies  Aspirin; Ibuprofen; Metformin and related; Reglan; Seroquel; Trazodone and nefazodone; Unasyn; and Zofran  Home Medications   Current Outpatient Rx  Name  Route  Sig  Dispense  Refill  . ACETAMINOPHEN 325 MG PO TABS   Oral   Take 650 mg by mouth every 6 (six) hours as needed.         Marland Kitchen XANAX PO   Oral   Take by mouth.         Marland Kitchen CIPROFLOXACIN HCL 500 MG PO TABS   Oral   Take 1 tablet (500 mg total) by mouth every 12 (twelve) hours.   28 tablet   0   . DOCUSATE SODIUM 100 MG PO CAPS   Oral   Take 1 capsule (100 mg total) by mouth every 12 (twelve) hours.   30 capsule   0   . GABAPENTIN 600 MG PO TABS   Oral   Take 600 mg by mouth 3 (three) times daily.         Marland Kitchen LORAZEPAM 1 MG PO TABS   Oral   Take 1 mg by mouth every 8 (eight) hours as needed.         . METHOCARBAMOL 500 MG PO TABS   Oral   Take 1 tablet (500 mg total) by mouth 2 (two) times daily.  20 tablet   0   . OXYCODONE-ACETAMINOPHEN 5-325 MG PO TABS   Oral   Take 2 tablets by mouth every 4 (four) hours as needed for pain.   15 tablet   0   . OXYCODONE-ACETAMINOPHEN 5-325 MG PO TABS   Oral   Take 1 tablet by mouth every 6 (six) hours as needed for pain.   10 tablet   0   . POLYETHYLENE GLYCOL 3350 PO POWD   Oral   Take 17 g by mouth 2 (two) times daily. Until daily soft stools  OTC   255 g   0   . PROMETHAZINE HCL 25 MG PO TABS   Oral   Take 1 tablet (25 mg total) by mouth every 6 (six) hours as needed for nausea.   12 tablet   0   . DISPOSABLE ENEMA 19-7 GM/118ML RE ENEM   Rectal   Place 1 enema rectally once. follow package directions   135 mL   0   . EFFEXOR PO   Oral   Take 250 mg by mouth 2 (two) times daily.           BP 136/91  Pulse 116  Temp 98.3 F (36.8 C) (Oral)  Resp 18  SpO2 100%  Physical Exam  Nursing note and vitals reviewed. Constitutional: She appears well-developed and well-nourished.       Patient tearful and  agitated.  HENT:  Head: Normocephalic and atraumatic.  Mouth/Throat: Oropharynx is clear and moist.  Eyes: Pupils are equal, round, and reactive to light. No scleral icterus.  Cardiovascular: Normal rate, regular rhythm and normal heart sounds.   Pulmonary/Chest: Effort normal and breath sounds normal.  Abdominal: Soft. Bowel sounds are normal. There is no tenderness.       CVA tenderness bilaterally with the right worse than the left.  Musculoskeletal: Normal range of motion. She exhibits tenderness. She exhibits no edema.       Tenderness to palpation of the left patella. Negative balloon test. Pain with internal and external rotation. No edema, erythema, or warmth appreciated over the joint.  Neurological: She is alert.  Skin: Skin is warm and dry.    ED Course  Procedures (including critical care time)  Labs Reviewed  URINALYSIS, ROUTINE W REFLEX MICROSCOPIC - Abnormal; Notable for the following:    APPearance CLOUDY (*)     Hgb urine dipstick TRACE (*)     All other components within normal limits  URINE MICROSCOPIC-ADD ON - Abnormal; Notable for the following:    Squamous Epithelial / LPF FEW (*)     All other components within normal limits  CBC WITH DIFFERENTIAL - Abnormal; Notable for the following:    RBC 3.56 (*)     Hemoglobin 11.4 (*)     HCT 35.0 (*)     Eosinophils Relative 8 (*)     All other components within normal limits  BASIC METABOLIC PANEL - Abnormal; Notable for the following:    Glucose, Bld 107 (*)     GFR calc non Af Amer 85 (*)     All other components within normal limits   Ct Abdomen Pelvis Wo Contrast  07/22/2012  *RADIOLOGY REPORT*  Clinical Data: Bilateral flank pain  CT ABDOMEN AND PELVIS WITHOUT CONTRAST  Technique:  Multidetector CT imaging of the abdomen and pelvis was performed following the standard protocol without intravenous contrast.  Comparison: 06/04/2012  Findings: No focal abnormalities seen in the liver or spleen.  The stomach,  duodenum, pancreas, and adrenal glands are unremarkable. The gallbladder is surgically absent.  Kidneys are unremarkable. No secondary changes in either kidney.  No renal, ureteral, or bladder stones.  No abdominal aortic aneurysm.  No free fluid or lymphadenopathy. Prominent stool volume noted in the abdominal segments of the colon.  No evidence for small bowel obstruction.  Imaging through the pelvis shows no free intraperitoneal fluid.  No pelvic sidewall lymphadenopathy.  The uterus is surgically absent. Bladder is unremarkable.  No adnexal mass.  No substantial diverticular change in the colon.  No colonic diverticulitis. The terminal ileum is normal. The appendix is not visualized, but there is no edema or inflammation in the region of the cecum.  Bone windows reveal no worrisome lytic or sclerotic osseous lesions.  IMPRESSION: No acute findings in the abdomen or pelvis.  Specifically, no findings to explain the patient's history of bilateral flank pain.  Prominent stool volume in the colon.  Clinical constipation would be a consideration.   Original Report Authenticated By: Kennith Center, M.D.    Dg Knee Complete 4 Views Left  07/22/2012  *RADIOLOGY REPORT*  Clinical Data: History of fall complaining of left knee pain.  LEFT KNEE - COMPLETE 4+ VIEW  Comparison: No priors.  Findings: Five views of the left knee demonstrate no definite acute displaced fracture, subluxation, dislocation, joint or soft tissue abnormality.  IMPRESSION:  1.  No acute radiographic abnormality of the left knee.   Original Report Authenticated By: Trudie Reed, M.D.      1. Bilateral flank pain   2. Constipation       MDM  Patient presented with multiple complaints to include flank pain and knee pain. Patient given pain medication with improvement. CBC, BMP, UA: remarkable for trace hematuria otherwise unremarkable. CT abdomen pelvis (rule out kidney stone): remarkable for moderate amount of stool, negative for stone.  Imaging of left knee: unremarkable. Patient discharged on short course of pain medication and bowel regimen for constipation. ACE wrap applied to left knee with instructions on Rest Ice & Elevation/ Patient instructed to follow-up with PCP about hematuria. Discharged with return precautions. No red flags for nephrolithiasis, perinephric abscess, or septic joint.       Pixie Casino, PA-C 07/22/12 1828

## 2012-07-22 NOTE — ED Notes (Signed)
Pt. Talks non stop and does not complain at this time.  Pt. Laughing about situation and reports she has been thru so much s---.

## 2012-07-22 NOTE — ED Notes (Signed)
Pt has multiple complaints. Pt sts she has recently been treated for uti and sts "kidneys still hurt". Pt also c/o left knee pain and vomiting.

## 2012-07-22 NOTE — ED Notes (Signed)
Pt. Able to move all extriemities  And has complaints of all body parts.

## 2012-07-22 NOTE — ED Notes (Signed)
Pt. Reports she also has Knee pain in the L knee and is able to walk on the L knee.

## 2012-07-26 NOTE — ED Provider Notes (Signed)
History/physical exam/procedure(s) were performed by non-physician practitioner and as supervising physician I was immediately available for consultation/collaboration. I have reviewed all notes and am in agreement with care and plan.   Hilario Quarry, MD 07/26/12 [phone]

## 2012-09-19 ENCOUNTER — Emergency Department (HOSPITAL_BASED_OUTPATIENT_CLINIC_OR_DEPARTMENT_OTHER): Payer: Medicare Other

## 2012-09-19 ENCOUNTER — Emergency Department (HOSPITAL_BASED_OUTPATIENT_CLINIC_OR_DEPARTMENT_OTHER)
Admission: EM | Admit: 2012-09-19 | Discharge: 2012-09-19 | Disposition: A | Discharge status: Home / Self Care | Payer: Medicare Other | Attending: Emergency Medicine | Admitting: Emergency Medicine

## 2012-09-19 ENCOUNTER — Encounter (HOSPITAL_BASED_OUTPATIENT_CLINIC_OR_DEPARTMENT_OTHER): Payer: Self-pay

## 2012-09-19 DIAGNOSIS — S8990XA Unspecified injury of unspecified lower leg, initial encounter: Secondary | ICD-10-CM | POA: Insufficient documentation

## 2012-09-19 DIAGNOSIS — T148XXA Other injury of unspecified body region, initial encounter: Secondary | ICD-10-CM

## 2012-09-19 DIAGNOSIS — F17200 Nicotine dependence, unspecified, uncomplicated: Secondary | ICD-10-CM | POA: Insufficient documentation

## 2012-09-19 DIAGNOSIS — N259 Disorder resulting from impaired renal tubular function, unspecified: Secondary | ICD-10-CM | POA: Insufficient documentation

## 2012-09-19 DIAGNOSIS — G8929 Other chronic pain: Secondary | ICD-10-CM | POA: Insufficient documentation

## 2012-09-19 DIAGNOSIS — Z859 Personal history of malignant neoplasm, unspecified: Secondary | ICD-10-CM | POA: Insufficient documentation

## 2012-09-19 DIAGNOSIS — Y9229 Other specified public building as the place of occurrence of the external cause: Secondary | ICD-10-CM | POA: Insufficient documentation

## 2012-09-19 DIAGNOSIS — Z8719 Personal history of other diseases of the digestive system: Secondary | ICD-10-CM | POA: Insufficient documentation

## 2012-09-19 DIAGNOSIS — S0083XA Contusion of other part of head, initial encounter: Secondary | ICD-10-CM | POA: Insufficient documentation

## 2012-09-19 DIAGNOSIS — W010XXA Fall on same level from slipping, tripping and stumbling without subsequent striking against object, initial encounter: Secondary | ICD-10-CM | POA: Insufficient documentation

## 2012-09-19 DIAGNOSIS — F411 Generalized anxiety disorder: Secondary | ICD-10-CM | POA: Insufficient documentation

## 2012-09-19 DIAGNOSIS — IMO0002 Reserved for concepts with insufficient information to code with codable children: Secondary | ICD-10-CM | POA: Insufficient documentation

## 2012-09-19 DIAGNOSIS — Y9301 Activity, walking, marching and hiking: Secondary | ICD-10-CM | POA: Insufficient documentation

## 2012-09-19 DIAGNOSIS — Z8739 Personal history of other diseases of the musculoskeletal system and connective tissue: Secondary | ICD-10-CM | POA: Insufficient documentation

## 2012-09-19 DIAGNOSIS — E1129 Type 2 diabetes mellitus with other diabetic kidney complication: Secondary | ICD-10-CM | POA: Insufficient documentation

## 2012-09-19 DIAGNOSIS — E1149 Type 2 diabetes mellitus with other diabetic neurological complication: Secondary | ICD-10-CM | POA: Insufficient documentation

## 2012-09-19 DIAGNOSIS — W19XXXA Unspecified fall, initial encounter: Secondary | ICD-10-CM

## 2012-09-19 DIAGNOSIS — E1142 Type 2 diabetes mellitus with diabetic polyneuropathy: Secondary | ICD-10-CM | POA: Insufficient documentation

## 2012-09-19 DIAGNOSIS — S0003XA Contusion of scalp, initial encounter: Secondary | ICD-10-CM | POA: Insufficient documentation

## 2012-09-19 DIAGNOSIS — M25569 Pain in unspecified knee: Secondary | ICD-10-CM

## 2012-09-19 HISTORY — DX: Dorsalgia, unspecified: M54.9

## 2012-09-19 HISTORY — DX: Other chronic pain: G89.29

## 2012-09-19 MED ORDER — OXYCODONE-ACETAMINOPHEN 5-325 MG PO TABS
1.0000 | ORAL_TABLET | Freq: Once | ORAL | Status: AC
  Administered 2012-09-19: 1 via ORAL
  Filled 2012-09-19: qty 2
  Filled 2012-09-19: qty 1

## 2012-09-19 NOTE — ED Provider Notes (Signed)
Medical screening examination/treatment/procedure(s) were performed by non-physician practitioner and as supervising physician I was immediately available for consultation/collaboration.     Celene Kras, MD 09/19/12 [phone]

## 2012-09-19 NOTE — ED Notes (Signed)
Fell on concrete 09/16/12-pain right knee, right arm, lower back and face-no LOC with fall

## 2012-09-19 NOTE — ED Provider Notes (Signed)
History     CSN: [id number]  Arrival date & time 09/19/12  1522   First MD Initiated Contact with Patient 09/19/12 1538      Chief Complaint  Patient presents with  . Fall    (Consider location/radiation/quality/duration/timing/severity/associated sxs/prior treatment) HPI Comments: Pt states that she was walking in to a restaurant and she slipped and fell and hit her face on the concrete:pt states that she tried ice and comfort measure at home without relief  Patient is a 52 y.o. female presenting with fall. The history is provided by the patient. No language interpreter was used.  Fall The accident occurred more than 2 days ago. She landed on concrete. The point of impact was the right knee (face). The pain is present in the right knee (face). The pain is severe. She was ambulatory at the scene. There was no entrapment after the fall. There was no drug use involved in the accident. There was no alcohol use involved in the accident. Pertinent negatives include no visual change, no fever, no numbness, no nausea, no vomiting, no loss of consciousness and no tingling.    Past Medical History  Diagnosis Date  . Arthritis   . Anxiety   . Renal disorder   . Rheumatoid arthritis   . Diabetes mellitus   . Cancer   . Fibromyalgia   . Neuropathy   . Hiatal hernia   . Osteoporosis   . Back pain   . Chronic pain     Past Surgical History  Procedure Date  . Tonsillectomy   . Abdominal hysterectomy   . Cholecystectomy   . Tubal ligation   . Tracheostomy   . Cesarean section   . Knee surgery     No family history on file.  History  Substance Use Topics  . Smoking status: Current Every Day Smoker  . Smokeless tobacco: Not on file  . Alcohol Use: No    OB History    Grav Para Term Preterm Abortions TAB SAB Ect Mult Living                  Review of Systems  Constitutional: Negative for fever.  Respiratory: Negative.   Cardiovascular: Negative.  Negative for chest  pain.  Gastrointestinal: Negative for nausea and vomiting.  Neurological: Negative for tingling, loss of consciousness and numbness.    Allergies  Aspirin; Ibuprofen; Metformin and related; Reglan; Seroquel; Trazodone and nefazodone; Unasyn; and Zofran  Home Medications   Current Outpatient Rx  Name  Route  Sig  Dispense  Refill  . BACLOFEN PO   Oral   Take by mouth.         Marland Kitchen HYDROXYZINE HCL PO   Oral   Take by mouth.         . ACETAMINOPHEN 325 MG PO TABS   Oral   Take 650 mg by mouth every 6 (six) hours as needed.         Marland Kitchen XANAX PO   Oral   Take by mouth.         Marland Kitchen CIPROFLOXACIN HCL 500 MG PO TABS   Oral   Take 1 tablet (500 mg total) by mouth every 12 (twelve) hours.   28 tablet   0   . DOCUSATE SODIUM 100 MG PO CAPS   Oral   Take 1 capsule (100 mg total) by mouth every 12 (twelve) hours.   30 capsule   0   . GABAPENTIN 600 MG PO TABS  Oral   Take 600 mg by mouth 3 (three) times daily.         Marland Kitchen LORAZEPAM 1 MG PO TABS   Oral   Take 1 mg by mouth every 8 (eight) hours as needed.         . METHOCARBAMOL 500 MG PO TABS   Oral   Take 1 tablet (500 mg total) by mouth 2 (two) times daily.   20 tablet   0   . OXYCODONE-ACETAMINOPHEN 5-325 MG PO TABS   Oral   Take 2 tablets by mouth every 4 (four) hours as needed for pain.   15 tablet   0   . OXYCODONE-ACETAMINOPHEN 5-325 MG PO TABS   Oral   Take 1 tablet by mouth every 6 (six) hours as needed for pain.   10 tablet   0   . POLYETHYLENE GLYCOL 3350 PO POWD   Oral   Take 17 g by mouth 2 (two) times daily. Until daily soft stools  OTC   255 g   0   . PROMETHAZINE HCL 25 MG PO TABS   Oral   Take 1 tablet (25 mg total) by mouth every 6 (six) hours as needed for nausea.   12 tablet   0   . DISPOSABLE ENEMA 19-7 GM/118ML RE ENEM   Rectal   Place 1 enema rectally once. follow package directions   135 mL   0   . EFFEXOR PO   Oral   Take 250 mg by mouth 2 (two) times daily.            BP 148/107  Pulse 100  Temp 98.2 F (36.8 C) (Oral)  Resp 18  SpO2 100%  Physical Exam  Nursing note and vitals reviewed. Constitutional: She is oriented to person, place, and time. She appears well-developed and well-nourished.  HENT:       Pt has abrasion noted around the right eye and cheek:pt able to open a close mouth without any problem  Eyes: Conjunctivae normal and EOM are normal. Pupils are equal, round, and reactive to light.  Neck: Neck supple.  Cardiovascular: Normal rate and regular rhythm.   Abdominal: Soft. Bowel sounds are normal.  Musculoskeletal: Normal range of motion.       Cervical back: She exhibits no tenderness and no bony tenderness.       Thoracic back: She exhibits normal range of motion and no tenderness.       Lumbar back: She exhibits no bony tenderness.       Pt generalized tenderness to the right knee:pt has full rom  Neurological: She is oriented to person, place, and time.  Skin:       Abrasion noted to the right knee  Psychiatric: She has a normal mood and affect.    ED Course  Procedures (including critical care time)  Labs Reviewed - No data to display Dg Knee Complete 4 Views Right  09/19/2012  *RADIOLOGY REPORT*  Clinical Data: Pain post trauma  RIGHT KNEE - COMPLETE 4+ VIEW  Comparison: None.  Findings: Frontal, lateral, and bilateral oblique views were obtained.  There is no fracture, dislocation, or effusion.  There is minimal spurring in all compartments.  There is slight narrowing of the patellofemoral joint.  No erosive change.  IMPRESSION: Mild osteoarthritic change.  No fracture or effusion.   Original Report Authenticated By: Bretta Bang, M.D.    Ct Maxillofacial Wo Cm  09/19/2012  *RADIOLOGY REPORT*  Clinical Data:  Fall striking face, abrasions to right eyebrow, right cheek and right chin  CT MAXILLOFACIAL WITHOUT CONTRAST  Technique:  Multidetector CT imaging of the maxillofacial structures was performed. Multiplanar CT  image reconstructions were also generated. Right side of face marked with a vitamin E capsule.  Comparison: None  Findings: Intraorbital soft tissue planes clear. Visualized intracranial contents unremarkable. Paranasal sinuses, visualized mastoid air cells and middle ear cavities clear bilaterally. Mild nasal septal deviation to the right. Minimal mucosal thickening at floor of the left maxillary sinus. Remaining paranasal sinuses clear. No air-fluid levels or significant opacification identified. No facial bone fracture or bone destruction. Tiny osteoma at a left ethmoid air cell. Visualized portion of the cervical spine normal appearance.  IMPRESSION: No significant sinus abnormalities.   Original Report Authenticated By: Ulyses Southward, M.D.      1. Abrasion   2. Contusion of face   3. Knee pain   4. Fall       MDM  Pt is seen by pain management and is given 120 oxycodone one a month:discussed with the pt that i would not be giving any more today       Teressa Lower, NP 09/19/12 1654

## 2012-12-16 ENCOUNTER — Emergency Department (HOSPITAL_BASED_OUTPATIENT_CLINIC_OR_DEPARTMENT_OTHER): Payer: Medicare Other

## 2012-12-16 ENCOUNTER — Emergency Department (HOSPITAL_BASED_OUTPATIENT_CLINIC_OR_DEPARTMENT_OTHER)
Admission: EM | Admit: 2012-12-16 | Discharge: 2012-12-16 | Disposition: A | Discharge status: Home / Self Care | Payer: Medicare Other | Attending: Emergency Medicine | Admitting: Emergency Medicine

## 2012-12-16 ENCOUNTER — Encounter (HOSPITAL_BASED_OUTPATIENT_CLINIC_OR_DEPARTMENT_OTHER): Payer: Self-pay | Admitting: *Deleted

## 2012-12-16 DIAGNOSIS — Z79899 Other long term (current) drug therapy: Secondary | ICD-10-CM | POA: Insufficient documentation

## 2012-12-16 DIAGNOSIS — E119 Type 2 diabetes mellitus without complications: Secondary | ICD-10-CM | POA: Insufficient documentation

## 2012-12-16 DIAGNOSIS — G8929 Other chronic pain: Secondary | ICD-10-CM | POA: Insufficient documentation

## 2012-12-16 DIAGNOSIS — Z87448 Personal history of other diseases of urinary system: Secondary | ICD-10-CM | POA: Insufficient documentation

## 2012-12-16 DIAGNOSIS — Z8669 Personal history of other diseases of the nervous system and sense organs: Secondary | ICD-10-CM | POA: Insufficient documentation

## 2012-12-16 DIAGNOSIS — Z859 Personal history of malignant neoplasm, unspecified: Secondary | ICD-10-CM | POA: Insufficient documentation

## 2012-12-16 DIAGNOSIS — R509 Fever, unspecified: Secondary | ICD-10-CM | POA: Insufficient documentation

## 2012-12-16 DIAGNOSIS — R109 Unspecified abdominal pain: Secondary | ICD-10-CM | POA: Insufficient documentation

## 2012-12-16 DIAGNOSIS — R300 Dysuria: Secondary | ICD-10-CM | POA: Insufficient documentation

## 2012-12-16 DIAGNOSIS — R319 Hematuria, unspecified: Secondary | ICD-10-CM | POA: Insufficient documentation

## 2012-12-16 DIAGNOSIS — R1110 Vomiting, unspecified: Secondary | ICD-10-CM | POA: Insufficient documentation

## 2012-12-16 DIAGNOSIS — Z8719 Personal history of other diseases of the digestive system: Secondary | ICD-10-CM | POA: Insufficient documentation

## 2012-12-16 DIAGNOSIS — F17200 Nicotine dependence, unspecified, uncomplicated: Secondary | ICD-10-CM | POA: Insufficient documentation

## 2012-12-16 DIAGNOSIS — Z8739 Personal history of other diseases of the musculoskeletal system and connective tissue: Secondary | ICD-10-CM | POA: Insufficient documentation

## 2012-12-16 DIAGNOSIS — F411 Generalized anxiety disorder: Secondary | ICD-10-CM | POA: Insufficient documentation

## 2012-12-16 LAB — URINE MICROSCOPIC-ADD ON

## 2012-12-16 LAB — URINALYSIS, ROUTINE W REFLEX MICROSCOPIC
Bilirubin Urine: NEGATIVE
Glucose, UA: NEGATIVE mg/dL
Hgb urine dipstick: NEGATIVE
Specific Gravity, Urine: 1.018 (ref 1.005–1.030)
pH: 5.5 (ref 5.0–8.0)

## 2012-12-16 MED ORDER — PROMETHAZINE HCL 25 MG/ML IJ SOLN
25.0000 mg | Freq: Once | INTRAMUSCULAR | Status: AC
  Administered 2012-12-16: 25 mg via INTRAVENOUS
  Filled 2012-12-16: qty 1

## 2012-12-16 MED ORDER — PROMETHAZINE HCL 25 MG/ML IJ SOLN
25.0000 mg | Freq: Once | INTRAMUSCULAR | Status: AC
  Administered 2012-12-16: 25 mg via INTRAMUSCULAR
  Filled 2012-12-16: qty 1

## 2012-12-16 MED ORDER — HYDROMORPHONE HCL PF 2 MG/ML IJ SOLN
2.0000 mg | Freq: Once | INTRAMUSCULAR | Status: AC
  Administered 2012-12-16: 2 mg via INTRAMUSCULAR
  Filled 2012-12-16: qty 1

## 2012-12-16 MED ORDER — HYDROMORPHONE HCL PF 2 MG/ML IJ SOLN
1.0000 mg | Freq: Once | INTRAMUSCULAR | Status: AC
  Administered 2012-12-16: 1 mg via INTRAMUSCULAR
  Filled 2012-12-16: qty 1

## 2012-12-16 MED ORDER — PROMETHAZINE HCL 25 MG PO TABS: 25.0000 mg | ORAL_TABLET | Freq: Four times a day (QID) | ORAL | Status: AC | PRN

## 2012-12-16 NOTE — ED Provider Notes (Signed)
History  This chart was scribed for Kimberly Cooper III, MD by Ardeen Jourdain, ED Scribe. This patient was seen in room MH01/MH01 and the patient's care was started at 1650.  CSN: [id number]  Arrival date & time 12/16/12  1603   First MD Initiated Contact with Patient 12/16/12 1650      Chief Complaint  Patient presents with  . Hematuria  . Flank Pain     The history is provided by the patient. No language interpreter was used.    Kimberly Sharp is a 52 y.o. female with a h/o kidney stones who presents to the Emergency Department complaining of gradual onset, unchanging, constant hematuria and right flank pain that began 2 weeks ago with associated fever and emesis. She states she was evaluated by her PCP for the symptoms and was given Bactrim. She reports completing the Bactrim but states the symptoms have not dissipated. She states she measured her fever at 100.4 PTA. She states she has a h/o kidney infections with one occuring every 3-4 months. She states the pain has been interrupting her ability to sleep. She states she has been unable to take her pain medication with out vomiting. Pt denies neck pain, sore throat, visual disturbance, CP, cough, SOB, abdominal pain, diarrhea, back pain, HA, syncope, seizures and rash as associated symptoms. Pt states she smokes a pack of cigarettes every 4 days.    Past Medical History  Diagnosis Date  . Arthritis   . Anxiety   . Renal disorder   . Rheumatoid arthritis   . Diabetes mellitus   . Cancer   . Fibromyalgia   . Neuropathy   . Hiatal hernia   . Osteoporosis   . Back pain   . Chronic pain     Past Surgical History  Procedure Laterality Date  . Tonsillectomy    . Abdominal hysterectomy    . Cholecystectomy    . Tubal ligation    . Tracheostomy    . Cesarean section    . Knee surgery      History reviewed. No pertinent family history.  History  Substance Use Topics  . Smoking status: Current Every Day Smoker -- 0.50  packs/day    Types: Cigarettes  . Smokeless tobacco: Not on file  . Alcohol Use: No   No OB history available.   Review of Systems  Constitutional: Negative for fever and chills.  HENT: Negative for ear pain.   Respiratory: Negative for shortness of breath.   Cardiovascular: Negative for chest pain.  Gastrointestinal: Negative for nausea and vomiting.  Genitourinary: Positive for dysuria, hematuria and flank pain.  Neurological: Negative for weakness.  All other systems reviewed and are negative.    Allergies  Aspirin; Ibuprofen; Metformin and related; Reglan; Seroquel; Trazodone and nefazodone; Unasyn; and Zofran  Home Medications   Current Outpatient Rx  Name  Route  Sig  Dispense  Refill  . acetaminophen (TYLENOL) 325 MG tablet   Oral   Take 650 mg by mouth every 6 (six) hours as needed.         . ALPRAZolam (XANAX PO)   Oral   Take by mouth.         Marland Kitchen BACLOFEN PO   Oral   Take by mouth.         . ciprofloxacin (CIPRO) 500 MG tablet   Oral   Take 1 tablet (500 mg total) by mouth every 12 (twelve) hours.   28 tablet   0   .  docusate sodium (COLACE) 100 MG capsule   Oral   Take 1 capsule (100 mg total) by mouth every 12 (twelve) hours.   30 capsule   0   . gabapentin (NEURONTIN) 600 MG tablet   Oral   Take 600 mg by mouth 3 (three) times daily.         Marland Kitchen HYDROXYZINE HCL PO   Oral   Take by mouth.         Marland Kitchen LORazepam (ATIVAN) 1 MG tablet   Oral   Take 1 mg by mouth every 8 (eight) hours as needed.         . methocarbamol (ROBAXIN) 500 MG tablet   Oral   Take 1 tablet (500 mg total) by mouth 2 (two) times daily.   20 tablet   0   . oxyCODONE-acetaminophen (PERCOCET/ROXICET) 5-325 MG per tablet   Oral   Take 2 tablets by mouth every 4 (four) hours as needed for pain.   15 tablet   0   . oxyCODONE-acetaminophen (PERCOCET/ROXICET) 5-325 MG per tablet   Oral   Take 1 tablet by mouth every 6 (six) hours as needed for pain.   10  tablet   0   . polyethylene glycol powder (GLYCOLAX/MIRALAX) powder   Oral   Take 17 g by mouth 2 (two) times daily. Until daily soft stools  OTC   255 g   0   . promethazine (PHENERGAN) 25 MG tablet   Oral   Take 1 tablet (25 mg total) by mouth every 6 (six) hours as needed for nausea.   12 tablet   0   . sodium phosphate (FLEET) enema   Rectal   Place 1 enema rectally once. follow package directions   135 mL   0   . Venlafaxine HCl (EFFEXOR PO)   Oral   Take 250 mg by mouth 2 (two) times daily.           Triage Vitals: BP 120/99  Pulse 105  Temp(Src) 98.6 F (37 C) (Oral)  Resp 16  Ht 5' (1.524 m)  Wt 180 lb (81.647 kg)  BMI 35.15 kg/m2  SpO2 100%  Physical Exam  Nursing note and vitals reviewed. Constitutional: She is oriented to person, place, and time. She appears well-developed and well-nourished. No distress.  HENT:  Head: Normocephalic and atraumatic.  Right Ear: External ear normal.  Left Ear: External ear normal.  Mouth/Throat: Oropharynx is clear and moist. No oropharyngeal exudate.  Eyes: Conjunctivae and EOM are normal. Pupils are equal, round, and reactive to light.  Neck: Normal range of motion. Neck supple. No tracheal deviation present.  Cardiovascular: Normal rate, regular rhythm and normal heart sounds.  Exam reveals no gallop and no friction rub.   No murmur heard. Pulmonary/Chest: Effort normal and breath sounds normal. No respiratory distress. She has no wheezes. She has no rales. She exhibits no tenderness.  Abdominal: Soft. Bowel sounds are normal. She exhibits no distension and no mass. There is tenderness. There is no rebound and no guarding.  Localizes pain to right flank, no palpable deformity or point tenderness   Musculoskeletal: Normal range of motion. She exhibits no edema and no tenderness.  Neurological: She is alert and oriented to person, place, and time.  Neurologically intact  Skin: Skin is warm and dry. She is not  diaphoretic.  Psychiatric: She has a normal mood and affect. Her behavior is normal.    ED Course  Procedures (including critical care  time)  DIAGNOSTIC STUDIES: Oxygen Saturation is 100% on room air, normal by my interpretation.    COORDINATION OF CARE:  5:12 PM-Discussed treatment plan which includes UA and urine microscopic with pt at bedside and pt agreed to plan.   Results for orders placed during the hospital encounter of 12/16/12  URINALYSIS, ROUTINE W REFLEX MICROSCOPIC      Result Value Range   Color, Urine YELLOW  YELLOW   APPearance CLEAR  CLEAR   Specific Gravity, Urine 1.018  1.005 - 1.030   pH 5.5  5.0 - 8.0   Glucose, UA NEGATIVE  NEGATIVE mg/dL   Hgb urine dipstick NEGATIVE  NEGATIVE   Bilirubin Urine NEGATIVE  NEGATIVE   Ketones, ur NEGATIVE  NEGATIVE mg/dL   Protein, ur NEGATIVE  NEGATIVE mg/dL   Urobilinogen, UA 0.2  0.0 - 1.0 mg/dL   Nitrite NEGATIVE  NEGATIVE   Leukocytes, UA SMALL (*) NEGATIVE  URINE MICROSCOPIC-ADD ON      Result Value Range   Squamous Epithelial / LPF RARE  RARE   WBC, UA 0-2  <3 WBC/hpf   Bacteria, UA RARE  RARE   Ct Abdomen Pelvis Wo Contrast was negative.  No kidney or ureteral calculi.  Pt continues to complain of pain and nausea.  Will remedicate once more, and then release.  Rx for Phenergan 25 mg q4h prn nausea.  She has pain medicine, oxycodone, prescribed by Dr. Nilsa Nutting, her pain management specialist.  12/16/2012  *RADIOLOGY REPORT*  Clinical Data: 2-day history of right flank pain  CT ABDOMEN AND PELVIS WITHOUT CONTRAST  Technique:  Multidetector CT imaging of the abdomen and pelvis was performed following the standard protocol without intravenous contrast.  Comparison: CT urogram of 07/22/2012  Findings: The lung bases are clear.  The liver is unremarkable in the unenhanced state.  Surgical clips are present from prior cholecystectomy.  The pancreas is stable in size and the pancreatic duct is not dilated.  The adrenal glands and  spleen are unremarkable.  The stomach is decompressed and cannot be evaluated. No renal calculi are seen and there is no evidence of hydronephrosis.  The abdominal aorta is normal in caliber.  No adenopathy is seen.  The distal ureters are normal in caliber and no distal ureteral calculus is seen.  The urinary bladder is unremarkable.  The uterus has previously been resected.  No fluid is noted within the pelvis. A moderate amount of feces is noted throughout the colon.  No colonic mucosal edema is seen.  The terminal ileum is unremarkable. The appendix is not definitely seen.  The lumbar vertebrae are in normal alignment.  No acute bony abnormality is seen.  IMPRESSION:  1.  No explanation for the patient's right flank pain is seen.  No renal or ureteral calculi are noted. 2.  No abnormality of urinary bladder is noted. 3.  Moderate amount of feces throughout the colon.   Original Report Authenticated By: Dwyane Dee, M.D.      1. Right flank pain   2. Chronic pain      I personally performed the services described in this documentation, which was scribed in my presence. The recorded information has been reviewed and is accurate.  Osvaldo Human, MD      Kimberly Cooper III, MD 12/16/12 [phone]

## 2012-12-16 NOTE — Discharge Instructions (Signed)
Kimberly Sharp, you had physical examination and urinalysis and CT of the abdomen and pelvis to check on you for right flank pain.  Fortunately, your tests did not show any kidney stone or UTI.  Dr. Ignacia Palma prescribed you the anti-nausea medicine Phenergan to take if needed for nausea.  You can continue to take oxycodone for your chronic pain, as prescribed by Dr. Nilsa Nutting.

## 2012-12-16 NOTE — ED Notes (Signed)
Pt c/o right flank pain x 2 weeks with blood in urine , seen by PMd completed bactrim , but symptoms cont, hx kidney stones

## 2013-02-01 ENCOUNTER — Encounter (HOSPITAL_BASED_OUTPATIENT_CLINIC_OR_DEPARTMENT_OTHER): Payer: Self-pay | Admitting: *Deleted

## 2013-02-01 ENCOUNTER — Emergency Department (HOSPITAL_BASED_OUTPATIENT_CLINIC_OR_DEPARTMENT_OTHER)
Admission: EM | Admit: 2013-02-01 | Discharge: 2013-02-02 | Disposition: A | Discharge status: Home / Self Care | Payer: Medicare Other | Attending: Emergency Medicine | Admitting: Emergency Medicine

## 2013-02-01 DIAGNOSIS — N390 Urinary tract infection, site not specified: Secondary | ICD-10-CM | POA: Insufficient documentation

## 2013-02-01 DIAGNOSIS — R300 Dysuria: Secondary | ICD-10-CM | POA: Insufficient documentation

## 2013-02-01 DIAGNOSIS — R1110 Vomiting, unspecified: Secondary | ICD-10-CM | POA: Insufficient documentation

## 2013-02-01 DIAGNOSIS — N949 Unspecified condition associated with female genital organs and menstrual cycle: Secondary | ICD-10-CM | POA: Insufficient documentation

## 2013-02-01 DIAGNOSIS — R3915 Urgency of urination: Secondary | ICD-10-CM | POA: Insufficient documentation

## 2013-02-01 DIAGNOSIS — R350 Frequency of micturition: Secondary | ICD-10-CM | POA: Insufficient documentation

## 2013-02-01 DIAGNOSIS — Z79899 Other long term (current) drug therapy: Secondary | ICD-10-CM | POA: Insufficient documentation

## 2013-02-01 HISTORY — DX: Interstitial cystitis (chronic) without hematuria: N30.10

## 2013-02-01 LAB — URINE MICROSCOPIC-ADD ON

## 2013-02-01 LAB — URINALYSIS, ROUTINE W REFLEX MICROSCOPIC
Nitrite: NEGATIVE
Specific Gravity, Urine: 1.025 (ref 1.005–1.030)
Urobilinogen, UA: 0.2 mg/dL (ref 0.0–1.0)

## 2013-02-01 LAB — CBC WITH DIFFERENTIAL/PLATELET
Basophils Relative: 1 % (ref 0–1)
Hemoglobin: 13.9 g/dL (ref 12.0–15.0)
MCHC: 33.9 g/dL (ref 30.0–36.0)
Monocytes Relative: 7 % (ref 3–12)
Neutro Abs: 4.1 10*3/uL (ref 1.7–7.7)
Neutrophils Relative %: 44 % (ref 43–77)
Platelets: 463 10*3/uL — ABNORMAL HIGH (ref 150–400)
RBC: 4.55 MIL/uL (ref 3.87–5.11)

## 2013-02-01 LAB — BASIC METABOLIC PANEL
BUN: 19 mg/dL (ref 6–23)
Chloride: 99 mEq/L (ref 96–112)
Potassium: 4.1 mEq/L (ref 3.5–5.1)
Sodium: 139 mEq/L (ref 135–145)

## 2013-02-01 MED ORDER — CIPROFLOXACIN IN D5W 400 MG/200ML IV SOLN
400.0000 mg | Freq: Once | INTRAVENOUS | Status: AC
  Administered 2013-02-01: 400 mg via INTRAVENOUS
  Filled 2013-02-01: qty 200

## 2013-02-01 MED ORDER — SODIUM CHLORIDE 0.9 % IV BOLUS (SEPSIS): 1000.0000 mL | Freq: Once | INTRAVENOUS | Status: DC

## 2013-02-01 MED ORDER — HYDROMORPHONE HCL PF 1 MG/ML IJ SOLN
1.0000 mg | Freq: Once | INTRAMUSCULAR | Status: AC
  Administered 2013-02-01: 1 mg via INTRAVENOUS
  Filled 2013-02-01: qty 1

## 2013-02-01 MED ORDER — HYDROMORPHONE HCL PF 2 MG/ML IJ SOLN
2.0000 mg | Freq: Once | INTRAMUSCULAR | Status: AC
  Administered 2013-02-01: 2 mg via INTRAVENOUS
  Filled 2013-02-01: qty 1

## 2013-02-01 MED ORDER — PROMETHAZINE HCL 25 MG/ML IJ SOLN
12.5000 mg | Freq: Once | INTRAMUSCULAR | Status: AC
  Administered 2013-02-01: 12.5 mg via INTRAVENOUS
  Filled 2013-02-01: qty 1

## 2013-02-01 NOTE — ED Notes (Signed)
Pt states she has a hx of IC, elevated liver enzymes, kidney problems. Today having difficulty urinating, low back and abd pain.

## 2013-02-01 NOTE — ED Provider Notes (Signed)
History    This chart was scribed for Kimberly Heumann Smitty Cords, MD, by Frederik Pear, ED scribe. The patient was seen in room MH10/MH10 and the patient's care was started at 2200.    CSN: [id number]  Arrival date & time 02/01/13  2006   First MD Initiated Contact with Patient 02/01/13 2200      Chief Complaint  Patient presents with  . Back Pain    (Consider location/radiation/quality/duration/timing/severity/associated sxs/prior treatment) Patient is a 52 y.o. female presenting with back pain. The history is provided by the patient and medical records. No language interpreter was used.  Back Pain Location:  Lumbar spine Quality:  Aching Radiates to: suprapubic. Pain severity:  Severe Pain is:  Unable to specify Onset quality:  Gradual Duration:  1 day Timing:  Constant Progression:  Unchanged Chronicity:  Recurrent Relieved by:  Nothing Worsened by:  Nothing tried Ineffective treatments:  Cold packs Associated symptoms: dysuria   Associated symptoms: no abdominal swelling, no bladder incontinence, no bowel incontinence, no chest pain, no fever, no headaches, no leg pain, no numbness, no paresthesias, no perianal numbness, no tingling, no weakness and no weight loss   Risk factors: not pregnant     HPI Comments: Kimberly Sharp is a 52 y.o. female with a h/o of IC and chronic pain who presents to the Emergency Department complaining of sudden onset, constant, worsening, severe lower back pain that radiates bilaterally to her abdomen as pressure and bloating that began this afternoon with associated difficulty urinating and vaginal pain. She also complains of emesis that began yesterday. She states that the current pain is different from her normal flare up because the pain is more severe, but states that she has found relieve previously from Dilaudid. She reports that she manages her chronic pain with 4 tablets of 5 mg Oxycodone and Robaxin at home. She states that she is unable to  take Tylenol and NSAIDS due to her elevated liver enymes.  Past Medical History  Diagnosis Date  . Arthritis   . Anxiety   . Renal disorder   . Rheumatoid arthritis   . Diabetes mellitus   . Cancer   . Fibromyalgia   . Neuropathy   . Hiatal hernia   . Osteoporosis   . Back pain   . Chronic pain     Past Surgical History  Procedure Laterality Date  . Tonsillectomy    . Abdominal hysterectomy    . Cholecystectomy    . Tubal ligation    . Tracheostomy    . Cesarean section    . Knee surgery      History reviewed. No pertinent family history.  History  Substance Use Topics  . Smoking status: Current Every Day Smoker -- 0.50 packs/day    Types: Cigarettes  . Smokeless tobacco: Not on file  . Alcohol Use: No    OB History   Grav Para Term Preterm Abortions TAB SAB Ect Mult Living                  Review of Systems  Constitutional: Negative for fever, chills and weight loss.  Cardiovascular: Negative for chest pain.  Gastrointestinal: Positive for vomiting. Negative for bowel incontinence.  Genitourinary: Positive for dysuria, urgency, frequency and difficulty urinating. Negative for bladder incontinence.  Musculoskeletal: Positive for back pain.  Neurological: Negative for tingling, weakness, numbness, headaches and paresthesias.  All other systems reviewed and are negative.    Allergies  Aspirin; Ibuprofen; Metformin and related; Reglan;  Seroquel; Trazodone and nefazodone; Unasyn; and Zofran  Home Medications   Current Outpatient Rx  Name  Route  Sig  Dispense  Refill  . albuterol (PROVENTIL HFA;VENTOLIN HFA) 108 (90 BASE) MCG/ACT inhaler   Inhalation   Inhale 2 puffs into the lungs every 6 (six) hours as needed for wheezing.         Marland Kitchen oxycodone (OXY-IR) 5 MG capsule   Oral   Take 5 mg by mouth every 4 (four) hours as needed.         Marland Kitchen acetaminophen (TYLENOL) 325 MG tablet   Oral   Take 650 mg by mouth every 6 (six) hours as needed.          . ALPRAZolam (XANAX PO)   Oral   Take by mouth.         Marland Kitchen BACLOFEN PO   Oral   Take by mouth.         . ciprofloxacin (CIPRO) 500 MG tablet   Oral   Take 1 tablet (500 mg total) by mouth every 12 (twelve) hours.   28 tablet   0   . docusate sodium (COLACE) 100 MG capsule   Oral   Take 1 capsule (100 mg total) by mouth every 12 (twelve) hours.   30 capsule   0   . gabapentin (NEURONTIN) 600 MG tablet   Oral   Take 600 mg by mouth 3 (three) times daily.         Marland Kitchen HYDROXYZINE HCL PO   Oral   Take by mouth.         Marland Kitchen LORazepam (ATIVAN) 1 MG tablet   Oral   Take 1 mg by mouth every 8 (eight) hours as needed.         . methocarbamol (ROBAXIN) 500 MG tablet   Oral   Take 1 tablet (500 mg total) by mouth 2 (two) times daily.   20 tablet   0   . oxyCODONE-acetaminophen (PERCOCET/ROXICET) 5-325 MG per tablet   Oral   Take 2 tablets by mouth every 4 (four) hours as needed for pain.   15 tablet   0   . oxyCODONE-acetaminophen (PERCOCET/ROXICET) 5-325 MG per tablet   Oral   Take 1 tablet by mouth every 6 (six) hours as needed for pain.   10 tablet   0   . polyethylene glycol powder (GLYCOLAX/MIRALAX) powder   Oral   Take 17 g by mouth 2 (two) times daily. Until daily soft stools  OTC   255 g   0   . promethazine (PHENERGAN) 25 MG tablet   Oral   Take 1 tablet (25 mg total) by mouth every 6 (six) hours as needed for nausea.   12 tablet   0   . promethazine (PHENERGAN) 25 MG tablet   Oral   Take 1 tablet (25 mg total) by mouth every 6 (six) hours as needed for nausea.   20 tablet   0   . sodium phosphate (FLEET) enema   Rectal   Place 1 enema rectally once. follow package directions   135 mL   0   . Venlafaxine HCl (EFFEXOR PO)   Oral   Take 250 mg by mouth 2 (two) times daily.           BP 131/89  Pulse 128  Temp(Src) 98.4 F (36.9 C) (Oral)  Resp 20  Ht 5' (1.524 m)  Wt 182 lb (82.555 kg)  BMI 35.54 kg/m2  SpO2 96%  Physical  Exam  Nursing note and vitals reviewed. Constitutional: She is oriented to person, place, and time. She appears well-developed and well-nourished. No distress.  HENT:  Head: Normocephalic and atraumatic.  Mouth/Throat: Oropharynx is clear and moist.  Eyes: Conjunctivae are normal. Pupils are equal, round, and reactive to light.  Neck: Normal range of motion.  Cardiovascular: Normal rate, regular rhythm, normal heart sounds and intact distal pulses.   No murmur heard. Pulmonary/Chest: Effort normal and breath sounds normal. No respiratory distress. She has no wheezes. She has no rales. She exhibits no tenderness.  Abdominal: Soft. Bowel sounds are normal. She exhibits no distension and no mass. There is no tenderness. There is no rebound and no guarding.  Genitourinary:  Chaperoned present. No vaginal lacerations. No external hemorrhoids. No fissures. No perineal tears.    Musculoskeletal: Normal range of motion. She exhibits no edema and no tenderness.  Neurological: She is alert and oriented to person, place, and time. She has normal reflexes.  Skin: Skin is warm and dry. No rash noted. She is not diaphoretic. No erythema. No pallor.  Psychiatric: Thought content normal.    ED Course  Procedures (including critical care time)  DIAGNOSTIC STUDIES: Oxygen Saturation is 96% on room air, adequate by my interpretation.    COORDINATION OF CARE:  22:06- Discussed planned course of treatment with the patient, including Cipro, Dilaudid, Phenergan, IV fluids, CBC with differential, basic metabolic panel, UA, and urine culture, who is agreeable at this time.  22:30- Medication Orders- ciprofloxacin (cipro) IVPB 400 mg- once, hydromorphone (dilaudid) injection 1 mg- once, promethazine (phenergan) injection 12.5 mg- once, sodium chloride 0.9% bolus 1,000- once.  23:45- Medication Orders- hydromorphone (dilaudid) injection 2 mg- once.  00:15- Medication Orders- promethazine (phenergan) injection  12.5 mg- once.  Results for orders placed during the hospital encounter of 02/01/13  URINALYSIS, ROUTINE W REFLEX MICROSCOPIC      Result Value Range   Color, Urine YELLOW  YELLOW   APPearance CLOUDY (*) CLEAR   Specific Gravity, Urine 1.025  1.005 - 1.030   pH 6.0  5.0 - 8.0   Glucose, UA NEGATIVE  NEGATIVE mg/dL   Hgb urine dipstick NEGATIVE  NEGATIVE   Bilirubin Urine NEGATIVE  NEGATIVE   Ketones, ur NEGATIVE  NEGATIVE mg/dL   Protein, ur NEGATIVE  NEGATIVE mg/dL   Urobilinogen, UA 0.2  0.0 - 1.0 mg/dL   Nitrite NEGATIVE  NEGATIVE   Leukocytes, UA MODERATE (*) NEGATIVE  URINE MICROSCOPIC-ADD ON      Result Value Range   Squamous Epithelial / LPF FEW (*) RARE   WBC, UA 11-20  <3 WBC/hpf   RBC / HPF 0-2  <3 RBC/hpf   Bacteria, UA MANY (*) RARE  CBC WITH DIFFERENTIAL      Result Value Range   WBC 9.2  4.0 - 10.5 K/uL   RBC 4.55  3.87 - 5.11 MIL/uL   Hemoglobin 13.9  12.0 - 15.0 g/dL   HCT 78.4  69.6 - 29.5 %   MCV 90.1  78.0 - 100.0 fL   MCH 30.5  26.0 - 34.0 pg   MCHC 33.9  30.0 - 36.0 g/dL   RDW 28.4  13.2 - 44.0 %   Platelets 463 (*) 150 - 400 K/uL   Neutrophils Relative % 44  43 - 77 %   Neutro Abs 4.1  1.7 - 7.7 K/uL   Lymphocytes Relative 42  12 - 46 %   Lymphs Abs 3.9  0.7 - 4.0 K/uL   Monocytes Relative 7  3 - 12 %   Monocytes Absolute 0.6  0.1 - 1.0 K/uL   Eosinophils Relative 6 (*) 0 - 5 %   Eosinophils Absolute 0.6  0.0 - 0.7 K/uL   Basophils Relative 1  0 - 1 %   Basophils Absolute 0.1  0.0 - 0.1 K/uL  BASIC METABOLIC PANEL      Result Value Range   Sodium 139  135 - 145 mEq/L   Potassium 4.1  3.5 - 5.1 mEq/L   Chloride 99  96 - 112 mEq/L   CO2 27  19 - 32 mEq/L   Glucose, Bld 122 (*) 70 - 99 mg/dL   BUN 19  6 - 23 mg/dL   Creatinine, Ser 9.60  0.50 - 1.10 mg/dL   Calcium 45.4  8.4 - 09.8 mg/dL   GFR calc non Af Amer 65 (*) >90 mL/min   GFR calc Af Amer 75 (*) >90 mL/min    Labs Reviewed  URINALYSIS, ROUTINE W REFLEX MICROSCOPIC - Abnormal;  Notable for the following:    APPearance CLOUDY (*)    Leukocytes, UA MODERATE (*)    All other components within normal limits  URINE MICROSCOPIC-ADD ON - Abnormal; Notable for the following:    Squamous Epithelial / LPF FEW (*)    Bacteria, UA MANY (*)    All other components within normal limits  URINE CULTURE   No results found.   No diagnosis found.  Medications  sodium chloride 0.9 % bolus 1,000 mL (0 mLs Intravenous Stopped 02/02/13 0025)  ciprofloxacin (CIPRO) IVPB 400 mg (0 mg Intravenous Stopped 02/02/13 0024)  HYDROmorphone (DILAUDID) injection 1 mg (1 mg Intravenous Given 02/01/13 2246)  promethazine (PHENERGAN) injection 12.5 mg (12.5 mg Intravenous Given 02/01/13 2246)  HYDROmorphone (DILAUDID) injection 2 mg (2 mg Intravenous Given 02/01/13 2335)  promethazine (PHENERGAN) injection 12.5 mg (12.5 mg Intravenous Given 02/02/13 0023)     MDM  Patient has oxycodone at home will prescribe phenergan previous urine culture was sensitive to cipro and will prescribe this medication.  Follow up with your urologist and family doctor for ongoing care. Return for intractable vomiting fevers or any concerns I personally performed the services described in this documentation, which was scribed in my presence. The recorded information has been reviewed and is accurate.         Jasmine Awe, MD 02/02/13 [phone]

## 2013-02-02 ENCOUNTER — Encounter (HOSPITAL_BASED_OUTPATIENT_CLINIC_OR_DEPARTMENT_OTHER): Payer: Self-pay | Admitting: *Deleted

## 2013-02-02 MED ORDER — PROMETHAZINE HCL 25 MG/ML IJ SOLN
12.5000 mg | Freq: Once | INTRAMUSCULAR | Status: AC
  Administered 2013-02-02: 12.5 mg via INTRAVENOUS
  Filled 2013-02-02: qty 1

## 2013-02-02 MED ORDER — CIPROFLOXACIN HCL 500 MG PO TABS: 500.0000 mg | ORAL_TABLET | Freq: Two times a day (BID) | ORAL | Status: DC

## 2013-02-02 MED ORDER — PROMETHAZINE HCL 25 MG RE SUPP: 25.0000 mg | Freq: Four times a day (QID) | RECTAL | Status: DC | PRN

## 2013-02-02 NOTE — ED Notes (Signed)
MD at bedside. 

## 2013-02-03 LAB — URINE CULTURE
Colony Count: NO GROWTH
Culture: NO GROWTH

## 2013-03-07 ENCOUNTER — Encounter (HOSPITAL_BASED_OUTPATIENT_CLINIC_OR_DEPARTMENT_OTHER): Payer: Self-pay | Admitting: *Deleted

## 2013-03-07 ENCOUNTER — Emergency Department (HOSPITAL_BASED_OUTPATIENT_CLINIC_OR_DEPARTMENT_OTHER)
Admission: EM | Admit: 2013-03-07 | Discharge: 2013-03-07 | Disposition: A | Discharge status: Home / Self Care | Payer: Medicare Other | Attending: Emergency Medicine | Admitting: Emergency Medicine

## 2013-03-07 DIAGNOSIS — R112 Nausea with vomiting, unspecified: Secondary | ICD-10-CM | POA: Insufficient documentation

## 2013-03-07 DIAGNOSIS — Z8739 Personal history of other diseases of the musculoskeletal system and connective tissue: Secondary | ICD-10-CM | POA: Insufficient documentation

## 2013-03-07 DIAGNOSIS — Z859 Personal history of malignant neoplasm, unspecified: Secondary | ICD-10-CM | POA: Insufficient documentation

## 2013-03-07 DIAGNOSIS — E119 Type 2 diabetes mellitus without complications: Secondary | ICD-10-CM | POA: Insufficient documentation

## 2013-03-07 DIAGNOSIS — G43909 Migraine, unspecified, not intractable, without status migrainosus: Secondary | ICD-10-CM | POA: Insufficient documentation

## 2013-03-07 DIAGNOSIS — Z8719 Personal history of other diseases of the digestive system: Secondary | ICD-10-CM | POA: Insufficient documentation

## 2013-03-07 DIAGNOSIS — Z87448 Personal history of other diseases of urinary system: Secondary | ICD-10-CM | POA: Insufficient documentation

## 2013-03-07 DIAGNOSIS — Z79899 Other long term (current) drug therapy: Secondary | ICD-10-CM | POA: Insufficient documentation

## 2013-03-07 DIAGNOSIS — F411 Generalized anxiety disorder: Secondary | ICD-10-CM | POA: Insufficient documentation

## 2013-03-07 DIAGNOSIS — F17200 Nicotine dependence, unspecified, uncomplicated: Secondary | ICD-10-CM | POA: Insufficient documentation

## 2013-03-07 MED ORDER — SODIUM CHLORIDE 0.9 % IV BOLUS (SEPSIS)
1000.0000 mL | Freq: Once | INTRAVENOUS | Status: AC
  Administered 2013-03-07: 1000 mL via INTRAVENOUS

## 2013-03-07 MED ORDER — DIAZEPAM 5 MG/ML IJ SOLN
5.0000 mg | Freq: Once | INTRAMUSCULAR | Status: AC
  Administered 2013-03-07: 5 mg via INTRAVENOUS
  Filled 2013-03-07: qty 2

## 2013-03-07 MED ORDER — DIPHENHYDRAMINE HCL 50 MG/ML IJ SOLN
25.0000 mg | Freq: Once | INTRAMUSCULAR | Status: AC
  Administered 2013-03-07: 25 mg via INTRAVENOUS
  Filled 2013-03-07: qty 1

## 2013-03-07 MED ORDER — PROMETHAZINE HCL 25 MG/ML IJ SOLN
25.0000 mg | Freq: Once | INTRAMUSCULAR | Status: AC
  Administered 2013-03-07: 25 mg via INTRAVENOUS
  Filled 2013-03-07: qty 1

## 2013-03-07 MED ORDER — DEXAMETHASONE SODIUM PHOSPHATE 10 MG/ML IJ SOLN
10.0000 mg | Freq: Once | INTRAMUSCULAR | Status: AC
  Administered 2013-03-07: 10 mg via INTRAVENOUS
  Filled 2013-03-07: qty 1

## 2013-03-07 NOTE — ED Notes (Signed)
Pt repeatedly stated "only dilaudid helps my headaches". Pt repeatedly stated "I am not a drug-seeker and I had better not see that word in my chart".

## 2013-03-07 NOTE — ED Notes (Signed)
Pt c/o migraine x 3 days

## 2013-03-07 NOTE — ED Provider Notes (Signed)
History    CSN: [id number] Arrival date & time 03/07/13  1407  First MD Initiated Contact with Patient 03/07/13 1430     Chief Complaint  Patient presents with  . Migraine   (Consider location/radiation/quality/duration/timing/severity/associated sxs/prior Treatment) Patient is a 52 y.o. female presenting with migraines. The history is provided by the patient. No language interpreter was used.  Migraine This is a new problem. The current episode started in the past 7 days. The problem occurs constantly. The problem has been unchanged. Associated symptoms include nausea and vomiting. Pertinent negatives include no congestion, coughing or fever. She has tried nothing for the symptoms.   Past Medical History  Diagnosis Date  . Arthritis   . Anxiety   . Renal disorder   . Rheumatoid arthritis(714.0)   . Diabetes mellitus   . Cancer   . Fibromyalgia   . Neuropathy   . Hiatal hernia   . Osteoporosis   . Back pain   . Chronic pain   . Interstitial cystitis    Past Surgical History  Procedure Laterality Date  . Tonsillectomy    . Abdominal hysterectomy    . Cholecystectomy    . Tubal ligation    . Tracheostomy    . Cesarean section    . Knee surgery     History reviewed. No pertinent family history. History  Substance Use Topics  . Smoking status: Current Every Day Smoker -- 0.50 packs/day    Types: Cigarettes  . Smokeless tobacco: Not on file  . Alcohol Use: No   OB History   Grav Para Term Preterm Abortions TAB SAB Ect Mult Living                 Review of Systems  Constitutional: Negative for fever.  HENT: Negative for congestion.   Respiratory: Negative for cough.   Cardiovascular: Negative.   Gastrointestinal: Positive for nausea and vomiting.    Allergies  Aspirin; Ibuprofen; Metformin and related; Reglan; Seroquel; Trazodone and nefazodone; Unasyn; and Zofran  Home Medications   Current Outpatient Rx  Name  Route  Sig  Dispense  Refill  .  acetaminophen (TYLENOL) 325 MG tablet   Oral   Take 650 mg by mouth every 6 (six) hours as needed.         Marland Kitchen albuterol (PROVENTIL HFA;VENTOLIN HFA) 108 (90 BASE) MCG/ACT inhaler   Inhalation   Inhale 2 puffs into the lungs every 6 (six) hours as needed for wheezing.         Marland Kitchen ALPRAZolam (XANAX PO)   Oral   Take by mouth.         Marland Kitchen BACLOFEN PO   Oral   Take by mouth.         . ciprofloxacin (CIPRO) 500 MG tablet   Oral   Take 1 tablet (500 mg total) by mouth every 12 (twelve) hours.   28 tablet   0   . ciprofloxacin (CIPRO) 500 MG tablet   Oral   Take 1 tablet (500 mg total) by mouth 2 (two) times daily.   20 tablet   0   . docusate sodium (COLACE) 100 MG capsule   Oral   Take 1 capsule (100 mg total) by mouth every 12 (twelve) hours.   30 capsule   0   . gabapentin (NEURONTIN) 600 MG tablet   Oral   Take 600 mg by mouth 3 (three) times daily.         Marland Kitchen HYDROXYZINE HCL  PO   Oral   Take by mouth.         Marland Kitchen LORazepam (ATIVAN) 1 MG tablet   Oral   Take 1 mg by mouth every 8 (eight) hours as needed.         . methocarbamol (ROBAXIN) 500 MG tablet   Oral   Take 1 tablet (500 mg total) by mouth 2 (two) times daily.   20 tablet   0   . oxycodone (OXY-IR) 5 MG capsule   Oral   Take 5 mg by mouth every 4 (four) hours as needed.         Marland Kitchen oxyCODONE-acetaminophen (PERCOCET/ROXICET) 5-325 MG per tablet   Oral   Take 2 tablets by mouth every 4 (four) hours as needed for pain.   15 tablet   0   . oxyCODONE-acetaminophen (PERCOCET/ROXICET) 5-325 MG per tablet   Oral   Take 1 tablet by mouth every 6 (six) hours as needed for pain.   10 tablet   0   . polyethylene glycol powder (GLYCOLAX/MIRALAX) powder   Oral   Take 17 g by mouth 2 (two) times daily. Until daily soft stools  OTC   255 g   0   . promethazine (PHENERGAN) 25 MG suppository   Rectal   Place 1 suppository (25 mg total) rectally every 6 (six) hours as needed for nausea.   12  each   0   . promethazine (PHENERGAN) 25 MG tablet   Oral   Take 1 tablet (25 mg total) by mouth every 6 (six) hours as needed for nausea.   12 tablet   0   . promethazine (PHENERGAN) 25 MG tablet   Oral   Take 1 tablet (25 mg total) by mouth every 6 (six) hours as needed for nausea.   20 tablet   0   . sodium phosphate (FLEET) enema   Rectal   Place 1 enema rectally once. follow package directions   135 mL   0   . Venlafaxine HCl (EFFEXOR PO)   Oral   Take 250 mg by mouth 2 (two) times daily.          BP 102/85  Pulse 94  Temp(Src) 97.9 F (36.6 C) (Oral)  Resp 16  Ht 5' (1.524 m)  Wt 183 lb (83.008 kg)  BMI 35.74 kg/m2  SpO2 100% Physical Exam  Nursing note and vitals reviewed. Constitutional: She is oriented to person, place, and time. She appears well-developed and well-nourished.  HENT:  Head: Normocephalic and atraumatic.  Right Ear: External ear normal.  Left Ear: External ear normal.  Eyes: Conjunctivae and EOM are normal. Pupils are equal, round, and reactive to light.  Neck: Normal range of motion. Neck supple.  Cardiovascular: Normal rate and regular rhythm.   Pulmonary/Chest: Effort normal and breath sounds normal.  Musculoskeletal: Normal range of motion.  Neurological: She is alert and oriented to person, place, and time.  Skin: Skin is warm and dry.  Psychiatric: She has a normal mood and affect.    ED Course  Procedures (including critical care time) Labs Reviewed - No data to display No results found. 1. Headache     MDM  Pt feeling slightly better;discussed with pt that she would not be getting narcotics today:pt is ready to go home:headache similar to previous headache  Teressa Lower, NP 03/07/13 1608

## 2013-03-08 NOTE — ED Provider Notes (Signed)
Medical screening examination/treatment/procedure(s) were performed by non-physician practitioner and as supervising physician I was immediately available for consultation/collaboration.   Shelda Jakes, MD 03/08/13 0930

## 2013-05-29 ENCOUNTER — Emergency Department (HOSPITAL_BASED_OUTPATIENT_CLINIC_OR_DEPARTMENT_OTHER): Payer: Medicare Other

## 2013-05-29 ENCOUNTER — Encounter (HOSPITAL_BASED_OUTPATIENT_CLINIC_OR_DEPARTMENT_OTHER): Payer: Self-pay

## 2013-05-29 ENCOUNTER — Emergency Department (HOSPITAL_BASED_OUTPATIENT_CLINIC_OR_DEPARTMENT_OTHER)
Admission: EM | Admit: 2013-05-29 | Discharge: 2013-05-29 | Disposition: A | Discharge status: Home / Self Care | Payer: Medicare Other | Attending: Emergency Medicine | Admitting: Emergency Medicine

## 2013-05-29 DIAGNOSIS — E119 Type 2 diabetes mellitus without complications: Secondary | ICD-10-CM | POA: Insufficient documentation

## 2013-05-29 DIAGNOSIS — Z9089 Acquired absence of other organs: Secondary | ICD-10-CM | POA: Insufficient documentation

## 2013-05-29 DIAGNOSIS — IMO0001 Reserved for inherently not codable concepts without codable children: Secondary | ICD-10-CM | POA: Insufficient documentation

## 2013-05-29 DIAGNOSIS — Z9851 Tubal ligation status: Secondary | ICD-10-CM | POA: Insufficient documentation

## 2013-05-29 DIAGNOSIS — G8929 Other chronic pain: Secondary | ICD-10-CM | POA: Insufficient documentation

## 2013-05-29 DIAGNOSIS — Z792 Long term (current) use of antibiotics: Secondary | ICD-10-CM | POA: Insufficient documentation

## 2013-05-29 DIAGNOSIS — F17200 Nicotine dependence, unspecified, uncomplicated: Secondary | ICD-10-CM | POA: Insufficient documentation

## 2013-05-29 DIAGNOSIS — R509 Fever, unspecified: Secondary | ICD-10-CM | POA: Insufficient documentation

## 2013-05-29 DIAGNOSIS — M129 Arthropathy, unspecified: Secondary | ICD-10-CM | POA: Insufficient documentation

## 2013-05-29 DIAGNOSIS — R1032 Left lower quadrant pain: Secondary | ICD-10-CM | POA: Insufficient documentation

## 2013-05-29 DIAGNOSIS — Z90710 Acquired absence of both cervix and uterus: Secondary | ICD-10-CM | POA: Insufficient documentation

## 2013-05-29 DIAGNOSIS — Z9889 Other specified postprocedural states: Secondary | ICD-10-CM | POA: Insufficient documentation

## 2013-05-29 DIAGNOSIS — M069 Rheumatoid arthritis, unspecified: Secondary | ICD-10-CM | POA: Insufficient documentation

## 2013-05-29 DIAGNOSIS — F411 Generalized anxiety disorder: Secondary | ICD-10-CM | POA: Insufficient documentation

## 2013-05-29 DIAGNOSIS — Z87448 Personal history of other diseases of urinary system: Secondary | ICD-10-CM | POA: Insufficient documentation

## 2013-05-29 DIAGNOSIS — Z859 Personal history of malignant neoplasm, unspecified: Secondary | ICD-10-CM | POA: Insufficient documentation

## 2013-05-29 DIAGNOSIS — G589 Mononeuropathy, unspecified: Secondary | ICD-10-CM | POA: Insufficient documentation

## 2013-05-29 DIAGNOSIS — M549 Dorsalgia, unspecified: Secondary | ICD-10-CM

## 2013-05-29 DIAGNOSIS — R112 Nausea with vomiting, unspecified: Secondary | ICD-10-CM | POA: Insufficient documentation

## 2013-05-29 DIAGNOSIS — R350 Frequency of micturition: Secondary | ICD-10-CM | POA: Insufficient documentation

## 2013-05-29 LAB — URINALYSIS, ROUTINE W REFLEX MICROSCOPIC
Ketones, ur: NEGATIVE mg/dL
Leukocytes, UA: NEGATIVE
Nitrite: NEGATIVE
Protein, ur: NEGATIVE mg/dL
Urobilinogen, UA: 0.2 mg/dL (ref 0.0–1.0)

## 2013-05-29 LAB — CBC WITH DIFFERENTIAL/PLATELET
Basophils Absolute: 0.1 10*3/uL (ref 0.0–0.1)
Basophils Relative: 1 % (ref 0–1)
MCHC: 33.6 g/dL (ref 30.0–36.0)
Monocytes Absolute: 0.5 10*3/uL (ref 0.1–1.0)
Neutro Abs: 6.7 10*3/uL (ref 1.7–7.7)
Neutrophils Relative %: 63 % (ref 43–77)
RDW: 15 % (ref 11.5–15.5)

## 2013-05-29 LAB — COMPREHENSIVE METABOLIC PANEL
AST: 21 U/L (ref 0–37)
Albumin: 4.8 g/dL (ref 3.5–5.2)
Chloride: 102 mEq/L (ref 96–112)
Creatinine, Ser: 0.7 mg/dL (ref 0.50–1.10)
Total Bilirubin: 0.2 mg/dL — ABNORMAL LOW (ref 0.3–1.2)

## 2013-05-29 MED ORDER — HYDROMORPHONE HCL PF 1 MG/ML IJ SOLN
1.0000 mg | Freq: Once | INTRAMUSCULAR | Status: AC
  Administered 2013-05-29: 1 mg via INTRAVENOUS
  Filled 2013-05-29: qty 1

## 2013-05-29 MED ORDER — SODIUM CHLORIDE 0.9 % IV SOLN
Freq: Once | INTRAVENOUS | Status: AC
  Administered 2013-05-29: 19:00:00 via INTRAVENOUS

## 2013-05-29 MED ORDER — PROMETHAZINE HCL 25 MG/ML IJ SOLN
12.5000 mg | Freq: Once | INTRAMUSCULAR | Status: AC
  Administered 2013-05-29: 12.5 mg via INTRAVENOUS
  Filled 2013-05-29: qty 1

## 2013-05-29 MED ORDER — METHOCARBAMOL 500 MG PO TABS: 500.0000 mg | ORAL_TABLET | Freq: Two times a day (BID) | ORAL | Status: DC

## 2013-05-29 NOTE — ED Notes (Signed)
bilat flank pain since 1am-fever started 7am

## 2013-05-29 NOTE — ED Provider Notes (Signed)
CSN: [id number]     Arrival date & time 05/29/13  1639 History  This chart was scribed for non-physician practitioner, Langston Masker, PA-C working with Rolan Bucco, MD by Greggory Stallion, ED scribe. This patient was seen in room MH10/MH10 and the patient's care was started at 6:37 PM.   Chief Complaint  Patient presents with  . Flank Pain   The history is provided by the patient. No language interpreter was used.    HPI Comments: Kimberly Sharp is a 52 y.o. female with h/o renal disorder who presents to the Emergency Department complaining of bilateral flank pain that started this morning around 1 AM. The left flank pain is worse than the right. Pt states she urinated "30 times last night". She states she had a fever around 7 AM this morning that was 101.6. Pt states nausea and emesis started shortly after. She states she is a diabetic that controls her sugars by diet. They were 86 last month.   Past Medical History  Diagnosis Date  . Arthritis   . Anxiety   . Renal disorder   . Rheumatoid arthritis(714.0)   . Diabetes mellitus   . Cancer   . Fibromyalgia   . Neuropathy   . Hiatal hernia   . Osteoporosis   . Back pain   . Chronic pain   . Interstitial cystitis    Past Surgical History  Procedure Laterality Date  . Tonsillectomy    . Abdominal hysterectomy    . Cholecystectomy    . Tubal ligation    . Tracheostomy    . Cesarean section    . Knee surgery     No family history on file. History  Substance Use Topics  . Smoking status: Current Every Day Smoker -- 0.50 packs/day    Types: Cigarettes  . Smokeless tobacco: Not on file  . Alcohol Use: No   OB History   Grav Para Term Preterm Abortions TAB SAB Ect Mult Living                 Review of Systems  Gastrointestinal: Positive for nausea and vomiting.  Genitourinary: Positive for frequency and flank pain.  All other systems reviewed and are negative.    Allergies  Aspirin; Ibuprofen; Metformin and related;  Reglan; Seroquel; Trazodone and nefazodone; Unasyn; and Zofran  Home Medications   Current Outpatient Rx  Name  Route  Sig  Dispense  Refill  . TiZANidine HCl (ZANAFLEX PO)   Oral   Take by mouth.         Marland Kitchen acetaminophen (TYLENOL) 325 MG tablet   Oral   Take 650 mg by mouth every 6 (six) hours as needed.         Marland Kitchen albuterol (PROVENTIL HFA;VENTOLIN HFA) 108 (90 BASE) MCG/ACT inhaler   Inhalation   Inhale 2 puffs into the lungs every 6 (six) hours as needed for wheezing.         Marland Kitchen ALPRAZolam (XANAX PO)   Oral   Take by mouth.         Marland Kitchen BACLOFEN PO   Oral   Take by mouth.         . ciprofloxacin (CIPRO) 500 MG tablet   Oral   Take 1 tablet (500 mg total) by mouth every 12 (twelve) hours.   28 tablet   0   . ciprofloxacin (CIPRO) 500 MG tablet   Oral   Take 1 tablet (500 mg total) by mouth 2 (two) times  daily.   20 tablet   0   . docusate sodium (COLACE) 100 MG capsule   Oral   Take 1 capsule (100 mg total) by mouth every 12 (twelve) hours.   30 capsule   0   . gabapentin (NEURONTIN) 600 MG tablet   Oral   Take 600 mg by mouth 3 (three) times daily.         Marland Kitchen HYDROXYZINE HCL PO   Oral   Take by mouth.         Marland Kitchen LORazepam (ATIVAN) 1 MG tablet   Oral   Take 1 mg by mouth every 8 (eight) hours as needed.         . methocarbamol (ROBAXIN) 500 MG tablet   Oral   Take 1 tablet (500 mg total) by mouth 2 (two) times daily.   20 tablet   0   . oxycodone (OXY-IR) 5 MG capsule   Oral   Take 5 mg by mouth every 4 (four) hours as needed.         Marland Kitchen oxyCODONE-acetaminophen (PERCOCET/ROXICET) 5-325 MG per tablet   Oral   Take 2 tablets by mouth every 4 (four) hours as needed for pain.   15 tablet   0   . oxyCODONE-acetaminophen (PERCOCET/ROXICET) 5-325 MG per tablet   Oral   Take 1 tablet by mouth every 6 (six) hours as needed for pain.   10 tablet   0   . polyethylene glycol powder (GLYCOLAX/MIRALAX) powder   Oral   Take 17 g by mouth 2  (two) times daily. Until daily soft stools  OTC   255 g   0   . promethazine (PHENERGAN) 25 MG suppository   Rectal   Place 1 suppository (25 mg total) rectally every 6 (six) hours as needed for nausea.   12 each   0   . promethazine (PHENERGAN) 25 MG tablet   Oral   Take 1 tablet (25 mg total) by mouth every 6 (six) hours as needed for nausea.   12 tablet   0   . promethazine (PHENERGAN) 25 MG tablet   Oral   Take 1 tablet (25 mg total) by mouth every 6 (six) hours as needed for nausea.   20 tablet   0   . sodium phosphate (FLEET) enema   Rectal   Place 1 enema rectally once. follow package directions   135 mL   0   . Venlafaxine HCl (EFFEXOR PO)   Oral   Take 250 mg by mouth 2 (two) times daily.          BP 114/63  Pulse 127  Temp(Src) 99.1 F (37.3 C) (Oral)  Resp 16  Ht 5' (1.524 m)  Wt 194 lb (87.998 kg)  BMI 37.89 kg/m2  SpO2 100%  Physical Exam  Nursing note and vitals reviewed. Constitutional: She is oriented to person, place, and time. She appears well-developed and well-nourished. No distress.  HENT:  Head: Normocephalic and atraumatic.  Eyes: EOM are normal.  Neck: Neck supple. No tracheal deviation present.  Cardiovascular: Normal rate.   Pulmonary/Chest: Effort normal. No respiratory distress.  Abdominal: Soft. There is tenderness (left lower abdomen ).  Musculoskeletal: Normal range of motion.  Pain with ROM.   Neurological: She is alert and oriented to person, place, and time.  Skin: Skin is warm and dry.  Psychiatric: She has a normal mood and affect. Her behavior is normal.    ED Course  Procedures (including  critical care time)  DIAGNOSTIC STUDIES: Oxygen Saturation is 100% on RA, normal by my interpretation.    COORDINATION OF CARE: 6:42 PM-Discussed treatment plan which includes pain medication, nausea medication and CT scan with pt at bedside and pt agreed to plan.   Labs Review Labs Reviewed  CBC WITH DIFFERENTIAL -  Abnormal; Notable for the following:    WBC 10.7 (*)    All other components within normal limits  COMPREHENSIVE METABOLIC PANEL - Abnormal; Notable for the following:    Glucose, Bld 123 (*)    Calcium 11.0 (*)    ALT 36 (*)    Total Bilirubin 0.2 (*)    All other components within normal limits  URINALYSIS, ROUTINE W REFLEX MICROSCOPIC   Imaging Review Ct Abdomen Pelvis Wo Contrast  05/29/2013   CLINICAL DATA:  Left flank/back pain, nausea. Prior hysterectomy, cholecystectomy, and appendectomy.  EXAM: CT ABDOMEN AND PELVIS WITHOUT CONTRAST  TECHNIQUE: Multidetector CT imaging of the abdomen and pelvis was performed following the standard protocol without intravenous contrast.  COMPARISON:  12/16/2012  FINDINGS: Mild linear scarring in the lateral left lower lobe.  Unenhanced liver, spleen, pancreas, and adrenal glands are within normal limits.  Status post cholecystectomy. No intrahepatic ductal dilatation. Common duct measures 11 mm likely postsurgical.  Kidneys are unremarkable. No renal calculi or hydronephrosis.  No evidence of bowel obstruction.  Prior appendectomy.  Atherosclerotic calcifications of the abdominal aorta and branch vessels.  No abdominopelvic ascites.  Small retroperitoneal lymph nodes which do not meet pathologic CT size criteria.  Status post hysterectomy. No adnexal masses.  No ureteral or bladder calculi.  Bladder is within normal limits.  Degenerative changes of the visualized thoracolumbar spine.  IMPRESSION: No renal, ureteral, or bladder calculi. No hydronephrosis.  No evidence of bowel obstruction.  Prior cholecystectomy, appendectomy, and hysterectomy.  No CT findings to account for the patient's left flank/back pain.   Electronically Signed   By: Charline Bills M.D.   On: 05/29/2013 19:43    MDM   1. Back pain     ueinw is negative,  Ct scan is normal.   Pt advised to follow up with her Md for recheck  I personally performed the services in this  documentation, which was scribed in my presence.  The recorded information has been reviewed and considered.   Barnet Pall.  Elson Areas, PA-C 05/29/13 2027  Lonia Skinner [place], PA-C 05/29/13 2027

## 2013-05-29 NOTE — ED Provider Notes (Signed)
Medical screening examination/treatment/procedure(s) were performed by non-physician practitioner and as supervising physician I was immediately available for consultation/collaboration.   Riddhi Grether, MD 05/29/13 2356 

## 2013-05-29 NOTE — ED Notes (Signed)
pa at bedside. 

## 2013-06-26 ENCOUNTER — Other Ambulatory Visit: Payer: Self-pay

## 2013-06-26 ENCOUNTER — Encounter (HOSPITAL_BASED_OUTPATIENT_CLINIC_OR_DEPARTMENT_OTHER): Payer: Self-pay | Admitting: Emergency Medicine

## 2013-06-26 ENCOUNTER — Emergency Department (HOSPITAL_BASED_OUTPATIENT_CLINIC_OR_DEPARTMENT_OTHER)
Admission: EM | Admit: 2013-06-26 | Discharge: 2013-06-26 | Disposition: A | Discharge status: Home / Self Care | Payer: Medicare Other | Attending: Emergency Medicine | Admitting: Emergency Medicine

## 2013-06-26 DIAGNOSIS — R259 Unspecified abnormal involuntary movements: Secondary | ICD-10-CM | POA: Insufficient documentation

## 2013-06-26 DIAGNOSIS — G8929 Other chronic pain: Secondary | ICD-10-CM | POA: Insufficient documentation

## 2013-06-26 DIAGNOSIS — M549 Dorsalgia, unspecified: Secondary | ICD-10-CM | POA: Insufficient documentation

## 2013-06-26 DIAGNOSIS — Z87448 Personal history of other diseases of urinary system: Secondary | ICD-10-CM | POA: Insufficient documentation

## 2013-06-26 DIAGNOSIS — F419 Anxiety disorder, unspecified: Secondary | ICD-10-CM

## 2013-06-26 DIAGNOSIS — Z8589 Personal history of malignant neoplasm of other organs and systems: Secondary | ICD-10-CM | POA: Insufficient documentation

## 2013-06-26 DIAGNOSIS — IMO0001 Reserved for inherently not codable concepts without codable children: Secondary | ICD-10-CM | POA: Insufficient documentation

## 2013-06-26 DIAGNOSIS — Z79899 Other long term (current) drug therapy: Secondary | ICD-10-CM | POA: Insufficient documentation

## 2013-06-26 DIAGNOSIS — R0602 Shortness of breath: Secondary | ICD-10-CM | POA: Insufficient documentation

## 2013-06-26 DIAGNOSIS — F17200 Nicotine dependence, unspecified, uncomplicated: Secondary | ICD-10-CM | POA: Insufficient documentation

## 2013-06-26 DIAGNOSIS — Z792 Long term (current) use of antibiotics: Secondary | ICD-10-CM | POA: Insufficient documentation

## 2013-06-26 DIAGNOSIS — R42 Dizziness and giddiness: Secondary | ICD-10-CM | POA: Insufficient documentation

## 2013-06-26 DIAGNOSIS — E119 Type 2 diabetes mellitus without complications: Secondary | ICD-10-CM | POA: Insufficient documentation

## 2013-06-26 DIAGNOSIS — Z8719 Personal history of other diseases of the digestive system: Secondary | ICD-10-CM | POA: Insufficient documentation

## 2013-06-26 DIAGNOSIS — F410 Panic disorder [episodic paroxysmal anxiety] without agoraphobia: Secondary | ICD-10-CM | POA: Insufficient documentation

## 2013-06-26 DIAGNOSIS — R079 Chest pain, unspecified: Secondary | ICD-10-CM | POA: Insufficient documentation

## 2013-06-26 DIAGNOSIS — R110 Nausea: Secondary | ICD-10-CM | POA: Insufficient documentation

## 2013-06-26 DIAGNOSIS — Z8669 Personal history of other diseases of the nervous system and sense organs: Secondary | ICD-10-CM | POA: Insufficient documentation

## 2013-06-26 LAB — BASIC METABOLIC PANEL
BUN: 17 mg/dL (ref 6–23)
CO2: 25 mEq/L (ref 19–32)
Calcium: 10.4 mg/dL (ref 8.4–10.5)
Creatinine, Ser: 0.8 mg/dL (ref 0.50–1.10)
Glucose, Bld: 101 mg/dL — ABNORMAL HIGH (ref 70–99)

## 2013-06-26 LAB — CBC
HCT: 42.3 % (ref 36.0–46.0)
Hemoglobin: 13.9 g/dL (ref 12.0–15.0)
MCH: 30.9 pg (ref 26.0–34.0)
MCV: 94 fL (ref 78.0–100.0)
Platelets: 319 10*3/uL (ref 150–400)
RBC: 4.5 MIL/uL (ref 3.87–5.11)

## 2013-06-26 MED ORDER — LORAZEPAM 1 MG PO TABS
1.0000 mg | ORAL_TABLET | Freq: Once | ORAL | Status: AC
  Administered 2013-06-26: 1 mg via ORAL
  Filled 2013-06-26: qty 1

## 2013-06-26 MED ORDER — LORAZEPAM 1 MG PO TABS: 1.0000 mg | ORAL_TABLET | Freq: Three times a day (TID) | ORAL | Status: DC | PRN

## 2013-06-26 MED ORDER — LORAZEPAM 1 MG PO TABS
ORAL_TABLET | ORAL | Status: AC
  Filled 2013-06-26: qty 1

## 2013-06-26 NOTE — Discharge Instructions (Signed)

## 2013-06-26 NOTE — ED Provider Notes (Signed)
CSN: [id number]     Arrival date & time 06/26/13  1444 History   First MD Initiated Contact with Patient 06/26/13 1518     Chief Complaint  Patient presents with  . Panic Attack   (Consider location/radiation/quality/duration/timing/severity/associated sxs/prior Treatment) HPI Pt with history as below has been off Xanax for about a year. Reports she has felt anxious, jittery, nervous for the last 2 days. Not improving with Vistaril. States she has had a variety of other associated symptoms such as nausea, dizziness, chest pains, SOB, feels like she is not in her own body etc. This is similar to previous episodes but she has been managing her symptoms recently without medication. She is on oxycodone for chronic back pain, has not run out recently.   Past Medical History  Diagnosis Date  . Anxiety   . Renal disorder   . Diabetes mellitus   . Cancer   . Fibromyalgia   . Neuropathy   . Hiatal hernia   . Osteoporosis   . Back pain   . Chronic pain   . Interstitial cystitis    Past Surgical History  Procedure Laterality Date  . Tonsillectomy    . Abdominal hysterectomy    . Cholecystectomy    . Tubal ligation    . Tracheostomy    . Cesarean section    . Knee surgery    . Appendectomy     History reviewed. No pertinent family history. History  Substance Use Topics  . Smoking status: Current Every Day Smoker -- 0.50 packs/day    Types: Cigarettes  . Smokeless tobacco: Not on file  . Alcohol Use: No   OB History   Grav Para Term Preterm Abortions TAB SAB Ect Mult Living                 Review of Systems All other systems reviewed and are negative except as noted in HPI.   Allergies  Aspirin; Ibuprofen; Metformin and related; Reglan; Seroquel; Trazodone and nefazodone; Unasyn; and Zofran  Home Medications   Current Outpatient Rx  Name  Route  Sig  Dispense  Refill  . acetaminophen (TYLENOL) 325 MG tablet   Oral   Take 650 mg by mouth every 6 (six) hours as  needed.         Marland Kitchen albuterol (PROVENTIL HFA;VENTOLIN HFA) 108 (90 BASE) MCG/ACT inhaler   Inhalation   Inhale 2 puffs into the lungs every 6 (six) hours as needed for wheezing.         Marland Kitchen ALPRAZolam (XANAX PO)   Oral   Take by mouth.         Marland Kitchen BACLOFEN PO   Oral   Take by mouth.         . ciprofloxacin (CIPRO) 500 MG tablet   Oral   Take 1 tablet (500 mg total) by mouth every 12 (twelve) hours.   28 tablet   0   . ciprofloxacin (CIPRO) 500 MG tablet   Oral   Take 1 tablet (500 mg total) by mouth 2 (two) times daily.   20 tablet   0   . docusate sodium (COLACE) 100 MG capsule   Oral   Take 1 capsule (100 mg total) by mouth every 12 (twelve) hours.   30 capsule   0   . gabapentin (NEURONTIN) 600 MG tablet   Oral   Take 600 mg by mouth 3 (three) times daily.         Marland Kitchen HYDROXYZINE HCL  PO   Oral   Take by mouth.         Marland Kitchen LORazepam (ATIVAN) 1 MG tablet   Oral   Take 1 mg by mouth every 8 (eight) hours as needed.         . methocarbamol (ROBAXIN) 500 MG tablet   Oral   Take 1 tablet (500 mg total) by mouth 2 (two) times daily.   20 tablet   0   . oxycodone (OXY-IR) 5 MG capsule   Oral   Take 5 mg by mouth every 4 (four) hours as needed.         Marland Kitchen oxyCODONE-acetaminophen (PERCOCET/ROXICET) 5-325 MG per tablet   Oral   Take 2 tablets by mouth every 4 (four) hours as needed for pain.   15 tablet   0   . oxyCODONE-acetaminophen (PERCOCET/ROXICET) 5-325 MG per tablet   Oral   Take 1 tablet by mouth every 6 (six) hours as needed for pain.   10 tablet   0   . polyethylene glycol powder (GLYCOLAX/MIRALAX) powder   Oral   Take 17 g by mouth 2 (two) times daily. Until daily soft stools  OTC   255 g   0   . promethazine (PHENERGAN) 25 MG suppository   Rectal   Place 1 suppository (25 mg total) rectally every 6 (six) hours as needed for nausea.   12 each   0   . promethazine (PHENERGAN) 25 MG tablet   Oral   Take 1 tablet (25 mg total) by  mouth every 6 (six) hours as needed for nausea.   12 tablet   0   . promethazine (PHENERGAN) 25 MG tablet   Oral   Take 1 tablet (25 mg total) by mouth every 6 (six) hours as needed for nausea.   20 tablet   0   . sodium phosphate (FLEET) enema   Rectal   Place 1 enema rectally once. follow package directions   135 mL   0   . TiZANidine HCl (ZANAFLEX PO)   Oral   Take by mouth.         . Venlafaxine HCl (EFFEXOR PO)   Oral   Take 250 mg by mouth 2 (two) times daily.          BP 132/90  Pulse 104  Temp(Src) 98.4 F (36.9 C) (Oral)  SpO2 99% Physical Exam  Nursing note and vitals reviewed. Constitutional: She is oriented to person, place, and time. She appears well-developed and well-nourished.  HENT:  Head: Normocephalic and atraumatic.  Eyes: EOM are normal. Pupils are equal, round, and reactive to light.  Neck: Normal range of motion. Neck supple.  Cardiovascular: Normal rate, normal heart sounds and intact distal pulses.   Pulmonary/Chest: Effort normal and breath sounds normal.  Abdominal: Bowel sounds are normal. She exhibits no distension. There is no tenderness.  Musculoskeletal: Normal range of motion. She exhibits no edema and no tenderness.  Neurological: She is alert and oriented to person, place, and time. She has normal strength. No cranial nerve deficit or sensory deficit. Coordination normal.  Fine tremor of hands  Skin: Skin is warm and dry. No rash noted.  Psychiatric:  anxious    ED Course  Procedures (including critical care time) Labs Review Labs Reviewed  BASIC METABOLIC PANEL - Abnormal; Notable for the following:    Glucose, Bld 101 (*)    GFR calc non Af Amer 85 (*)    All other components  within normal limits  CBC   Imaging Review No results found.  EKG Interpretation        Date: 06/26/2013  Rate: 82  Rhythm: normal sinus rhythm  QRS Axis: normal  Intervals: normal  ST/T Wave abnormalities: normal  Conduction  Disutrbances:none  Narrative Interpretation:   Old EKG Reviewed: unchanged    MDM   1. Anxiety     Pt feeling better after Ativan. No concerning lab findings. EKG unremarkable. No concern for ACS or PE. PCP followup.     Kaion Tisdale B. Bernette Mayers, MD 06/26/13 [phone]

## 2013-06-26 NOTE — ED Notes (Signed)
Pt states that on Tues pt started feeling anxious but has become worse and has had feeling of "racing"

## 2013-07-22 ENCOUNTER — Emergency Department (HOSPITAL_BASED_OUTPATIENT_CLINIC_OR_DEPARTMENT_OTHER)
Admission: EM | Admit: 2013-07-22 | Discharge: 2013-07-22 | Disposition: A | Discharge status: Home / Self Care | Payer: Medicare Other | Attending: Emergency Medicine | Admitting: Emergency Medicine

## 2013-07-22 ENCOUNTER — Encounter (HOSPITAL_BASED_OUTPATIENT_CLINIC_OR_DEPARTMENT_OTHER): Payer: Self-pay | Admitting: Emergency Medicine

## 2013-07-22 ENCOUNTER — Emergency Department (HOSPITAL_BASED_OUTPATIENT_CLINIC_OR_DEPARTMENT_OTHER): Payer: Medicare Other

## 2013-07-22 DIAGNOSIS — Z859 Personal history of malignant neoplasm, unspecified: Secondary | ICD-10-CM | POA: Insufficient documentation

## 2013-07-22 DIAGNOSIS — Z8719 Personal history of other diseases of the digestive system: Secondary | ICD-10-CM | POA: Insufficient documentation

## 2013-07-22 DIAGNOSIS — F411 Generalized anxiety disorder: Secondary | ICD-10-CM | POA: Insufficient documentation

## 2013-07-22 DIAGNOSIS — G589 Mononeuropathy, unspecified: Secondary | ICD-10-CM | POA: Insufficient documentation

## 2013-07-22 DIAGNOSIS — Z79899 Other long term (current) drug therapy: Secondary | ICD-10-CM | POA: Insufficient documentation

## 2013-07-22 DIAGNOSIS — E119 Type 2 diabetes mellitus without complications: Secondary | ICD-10-CM | POA: Insufficient documentation

## 2013-07-22 DIAGNOSIS — R1110 Vomiting, unspecified: Secondary | ICD-10-CM | POA: Insufficient documentation

## 2013-07-22 DIAGNOSIS — Z792 Long term (current) use of antibiotics: Secondary | ICD-10-CM | POA: Insufficient documentation

## 2013-07-22 DIAGNOSIS — F17200 Nicotine dependence, unspecified, uncomplicated: Secondary | ICD-10-CM | POA: Insufficient documentation

## 2013-07-22 DIAGNOSIS — G8929 Other chronic pain: Secondary | ICD-10-CM | POA: Insufficient documentation

## 2013-07-22 DIAGNOSIS — Z87448 Personal history of other diseases of urinary system: Secondary | ICD-10-CM | POA: Insufficient documentation

## 2013-07-22 DIAGNOSIS — IMO0001 Reserved for inherently not codable concepts without codable children: Secondary | ICD-10-CM | POA: Insufficient documentation

## 2013-07-22 LAB — CBC WITH DIFFERENTIAL/PLATELET
Basophils Absolute: 0.1 10*3/uL (ref 0.0–0.1)
Eosinophils Absolute: 0.6 10*3/uL (ref 0.0–0.7)
Eosinophils Relative: 9 % — ABNORMAL HIGH (ref 0–5)
MCH: 30.9 pg (ref 26.0–34.0)
MCHC: 32.7 g/dL (ref 30.0–36.0)
MCV: 94.5 fL (ref 78.0–100.0)
Platelets: 335 10*3/uL (ref 150–400)
RDW: 14.1 % (ref 11.5–15.5)

## 2013-07-22 LAB — BASIC METABOLIC PANEL
Calcium: 9.6 mg/dL (ref 8.4–10.5)
Creatinine, Ser: 0.9 mg/dL (ref 0.50–1.10)
GFR calc non Af Amer: 73 mL/min — ABNORMAL LOW (ref >90)
Sodium: 138 mEq/L (ref 135–145)

## 2013-07-22 MED ORDER — SODIUM CHLORIDE 0.9 % IV SOLN: 1000.0000 mL | INTRAVENOUS | Status: DC

## 2013-07-22 MED ORDER — SODIUM CHLORIDE 0.9 % IV SOLN
1000.0000 mL | Freq: Once | INTRAVENOUS | Status: AC
  Administered 2013-07-22: 1000 mL via INTRAVENOUS

## 2013-07-22 MED ORDER — PROMETHAZINE HCL 25 MG/ML IJ SOLN
12.5000 mg | Freq: Once | INTRAMUSCULAR | Status: AC
  Administered 2013-07-22: 12.5 mg via INTRAVENOUS
  Filled 2013-07-22: qty 1

## 2013-07-22 MED ORDER — BENZONATATE 100 MG PO CAPS: 100.0000 mg | ORAL_CAPSULE | Freq: Three times a day (TID) | ORAL | Status: AC

## 2013-07-22 MED ORDER — PROMETHAZINE HCL 25 MG RE SUPP: 25.0000 mg | Freq: Four times a day (QID) | RECTAL | Status: DC | PRN

## 2013-07-22 NOTE — ED Provider Notes (Signed)
CSN: [id number]     Arrival date & time 07/22/13  1432 History   First MD Initiated Contact with Patient 07/22/13 1530     Chief Complaint  Patient presents with  . Cough    Patient is a 52 y.o. female presenting with cough. The history is provided by the patient.  Cough Cough characteristics: yellow sputum earlier, now dry. Duration:  2 weeks Timing:  Constant Progression since onset: not getting any better. Smoker: yes   Context: upper respiratory infection   Relieved by:  Nothing Ineffective treatments: she was started on antibiotics 8 days ago, it has not gotten any better. Pt is now having episodes of vomiting especially after coughing. She takes her medications for chronic pain and has not been able to keep any of those down because of her frequent coughing. She has tried taking her inhalers but has not noticed any improvement. She is not smoking cigarettes as much as usual because she is feeling too sick. Patient states she was feeling really bad and has never felt this ill before.  Past Medical History  Diagnosis Date  . Anxiety   . Renal disorder   . Diabetes mellitus   . Cancer   . Fibromyalgia   . Neuropathy   . Hiatal hernia   . Osteoporosis   . Back pain   . Chronic pain   . Interstitial cystitis    Past Surgical History  Procedure Laterality Date  . Tonsillectomy    . Abdominal hysterectomy    . Cholecystectomy    . Tubal ligation    . Tracheostomy    . Cesarean section    . Knee surgery    . Appendectomy     No family history on file. History  Substance Use Topics  . Smoking status: Current Every Day Smoker -- 0.50 packs/day    Types: Cigarettes  . Smokeless tobacco: Not on file  . Alcohol Use: No   OB History   Grav Para Term Preterm Abortions TAB SAB Ect Mult Living                 Review of Systems  Respiratory: Positive for cough.   All other systems reviewed and are negative.    Allergies  Aspirin; Ibuprofen; Metformin and related;  Reglan; Seroquel; Trazodone and nefazodone; Unasyn; and Zofran  Home Medications   Current Outpatient Rx  Name  Route  Sig  Dispense  Refill  . acetaminophen (TYLENOL) 325 MG tablet   Oral   Take 650 mg by mouth every 6 (six) hours as needed.         Marland Kitchen albuterol (PROVENTIL HFA;VENTOLIN HFA) 108 (90 BASE) MCG/ACT inhaler   Inhalation   Inhale 2 puffs into the lungs every 6 (six) hours as needed for wheezing.         . benzonatate (TESSALON) 100 MG capsule   Oral   Take 1 capsule (100 mg total) by mouth every 8 (eight) hours.   21 capsule   0   . ciprofloxacin (CIPRO) 500 MG tablet   Oral   Take 1 tablet (500 mg total) by mouth 2 (two) times daily.   20 tablet   0   . docusate sodium (COLACE) 100 MG capsule   Oral   Take 1 capsule (100 mg total) by mouth every 12 (twelve) hours.   30 capsule   0   . gabapentin (NEURONTIN) 600 MG tablet   Oral   Take 600 mg by mouth  3 (three) times daily.         Marland Kitchen HYDROXYZINE HCL PO   Oral   Take by mouth.         Marland Kitchen LORazepam (ATIVAN) 1 MG tablet   Oral   Take 1 tablet (1 mg total) by mouth 3 (three) times daily as needed for anxiety.   6 tablet   0   . oxycodone (OXY-IR) 5 MG capsule   Oral   Take 5 mg by mouth every 4 (four) hours as needed.         . promethazine (PHENERGAN) 25 MG suppository   Rectal   Place 1 suppository (25 mg total) rectally every 6 (six) hours as needed for nausea.   12 each   0   . promethazine (PHENERGAN) 25 MG suppository   Rectal   Place 1 suppository (25 mg total) rectally every 6 (six) hours as needed for nausea or vomiting.   12 each   0   . promethazine (PHENERGAN) 25 MG tablet   Oral   Take 1 tablet (25 mg total) by mouth every 6 (six) hours as needed for nausea.   12 tablet   0   . promethazine (PHENERGAN) 25 MG tablet   Oral   Take 1 tablet (25 mg total) by mouth every 6 (six) hours as needed for nausea.   20 tablet   0   . TiZANidine HCl (ZANAFLEX PO)   Oral    Take by mouth.          BP 131/98  Pulse 106  Temp(Src) 98.8 F (37.1 C) (Oral)  Resp 18  SpO2 97% Physical Exam  Nursing note and vitals reviewed. Constitutional: She appears well-developed and well-nourished.  HENT:  Head: Normocephalic and atraumatic.  Right Ear: External ear normal.  Left Ear: External ear normal.  Mouth/Throat: No oropharyngeal exudate (mm dry).  Eyes: Conjunctivae are normal. Right eye exhibits no discharge. Left eye exhibits no discharge. No scleral icterus.  Neck: Neck supple. No tracheal deviation present.  Cardiovascular: Normal rate, regular rhythm and intact distal pulses.   Pulmonary/Chest: Effort normal and breath sounds normal. No stridor. No respiratory distress. She has no wheezes. She has no rales.  Frequent coughing  Abdominal: Soft. Bowel sounds are normal. She exhibits no distension. There is no tenderness. There is no rebound and no guarding.  Musculoskeletal: She exhibits no edema and no tenderness.  Neurological: She is alert. She has normal strength. No sensory deficit. Cranial nerve deficit:  no gross defecits noted. She exhibits normal muscle tone. She displays no seizure activity. Coordination normal.  Skin: Skin is warm and dry. No rash noted. She is not diaphoretic.  Psychiatric: She has a normal mood and affect.    ED Course  Procedures (including critical care time) Labs Review Labs Reviewed  CBC WITH DIFFERENTIAL - Abnormal; Notable for the following:    RBC 3.66 (*)    Hemoglobin 11.3 (*)    HCT 34.6 (*)    Eosinophils Relative 9 (*)    All other components within normal limits  BASIC METABOLIC PANEL - Abnormal; Notable for the following:    Glucose, Bld 115 (*)    GFR calc non Af Amer 73 (*)    GFR calc Af Amer 85 (*)    All other components within normal limits   Imaging Review Dg Chest 2 View  07/22/2013   CLINICAL DATA:  Cough, congestion, shortness of breath  EXAM: CHEST  2 VIEW  COMPARISON:  07/04/2013   FINDINGS: Lungs are essentially clear. No focal consolidation. No pleural effusion or pneumothorax.  The heart is normal in size.  Mild degenerative changes of the visualized thoracolumbar spine.  Cholecystectomy clips.  IMPRESSION: No evidence of acute cardiopulmonary disease.   Electronically Signed   By: Charline Bills M.D.   On: 07/22/2013 15:11    EKG Interpretation   None       MDM   1. Influenza-like illness     Patient's symptoms are consistent with a viral illness. Possible influenza-like illness. Chest x-ray does not show pneumonia. I do not think that another course of antibiotics are indicated. On exam she is not wheezing.  She was given IV fluids for comfort.  No dehydration.   Celene Kras, MD 07/22/13 [phone]

## 2013-07-22 NOTE — ED Notes (Signed)
Cough x 13 days. Has taken Levaquin with no relief. Vomiting x 3 days.

## 2013-07-24 ENCOUNTER — Encounter (HOSPITAL_COMMUNITY): Payer: Self-pay | Admitting: Psychiatry

## 2013-07-24 ENCOUNTER — Ambulatory Visit (INDEPENDENT_AMBULATORY_CARE_PROVIDER_SITE_OTHER): Payer: Medicare Other | Admitting: Psychiatry

## 2013-07-24 VITALS — BP 98/76 | HR 86 | Ht 61.0 in | Wt 205.0 lb

## 2013-07-24 DIAGNOSIS — F410 Panic disorder [episodic paroxysmal anxiety] without agoraphobia: Secondary | ICD-10-CM

## 2013-07-24 DIAGNOSIS — F411 Generalized anxiety disorder: Secondary | ICD-10-CM

## 2013-07-24 MED ORDER — VENLAFAXINE HCL ER 37.5 MG PO CP24: 37.5000 mg | ORAL_CAPSULE | Freq: Every day | ORAL | Status: AC
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
## 2013-07-25 ENCOUNTER — Telehealth (HOSPITAL_COMMUNITY): Payer: Self-pay

## 2013-07-25 DIAGNOSIS — F411 Generalized anxiety disorder: Secondary | ICD-10-CM | POA: Insufficient documentation

## 2013-07-25 DIAGNOSIS — F410 Panic disorder [episodic paroxysmal anxiety] without agoraphobia: Secondary | ICD-10-CM | POA: Insufficient documentation

## 2013-07-25 LAB — DRUG SCREEN, URINE
Benzodiazepines.: NEGATIVE
Creatinine,U: 93 mg/dL
Methadone: NEGATIVE
Opiates: POSITIVE — AB
Phencyclidine (PCP): NEGATIVE
Propoxyphene: NEGATIVE

## 2013-07-25 MED ORDER — LORAZEPAM 0.5 MG PO TABS: 0.5000 mg | ORAL_TABLET | ORAL | Status: AC | PRN

## 2013-07-25 MED ORDER — LORAZEPAM 0.5 MG PO TABS: 1.0000 mg | ORAL_TABLET | ORAL | Status: DC | PRN

## 2013-07-25 NOTE — Telephone Encounter (Addendum)
Spoke to patient. Called in Lorazepam 0.5 mg Qday, anxiety. #7 no refills.

## 2013-08-27 ENCOUNTER — Telehealth (HOSPITAL_COMMUNITY): Payer: Self-pay

## 2013-08-27 NOTE — Telephone Encounter (Signed)
Called patient. Her granddaughter was placed in foster care.  Her son is  She will go see her family doctor on Saturday. She reports that she has not slept for 2 days. Talked to her son, the patient reports she has gone an entire week without any sleep at all.  PLAN: Will call Ativan 0.5 mg daily. #2 No refills.

## 2013-08-29 ENCOUNTER — Ambulatory Visit (HOSPITAL_COMMUNITY): Payer: Medicare Other | Admitting: Psychiatry

## 2013-08-29 NOTE — Telephone Encounter (Signed)
Called patient. Left message. As patient reports she does not want to come back to this clinic will discharge her. Per pharmacy she has filled my on day prescription and has filled a prescription through her PCP of 90 tablet to be taken TID. Her son and father picked up the phone, I informed them that we could discharge the patient if she desired.

## 2013-11-20 ENCOUNTER — Emergency Department (HOSPITAL_BASED_OUTPATIENT_CLINIC_OR_DEPARTMENT_OTHER)
Admission: EM | Admit: 2013-11-20 | Discharge: 2013-11-20 | Disposition: A | Discharge status: Home / Self Care | Payer: Medicare Other | Attending: Emergency Medicine | Admitting: Emergency Medicine

## 2013-11-20 ENCOUNTER — Encounter (HOSPITAL_BASED_OUTPATIENT_CLINIC_OR_DEPARTMENT_OTHER): Payer: Self-pay | Admitting: Emergency Medicine

## 2013-11-20 DIAGNOSIS — R197 Diarrhea, unspecified: Secondary | ICD-10-CM | POA: Insufficient documentation

## 2013-11-20 DIAGNOSIS — Z859 Personal history of malignant neoplasm, unspecified: Secondary | ICD-10-CM | POA: Insufficient documentation

## 2013-11-20 DIAGNOSIS — R1110 Vomiting, unspecified: Secondary | ICD-10-CM

## 2013-11-20 DIAGNOSIS — Z79899 Other long term (current) drug therapy: Secondary | ICD-10-CM | POA: Insufficient documentation

## 2013-11-20 DIAGNOSIS — J3489 Other specified disorders of nose and nasal sinuses: Secondary | ICD-10-CM | POA: Insufficient documentation

## 2013-11-20 DIAGNOSIS — G8929 Other chronic pain: Secondary | ICD-10-CM | POA: Insufficient documentation

## 2013-11-20 DIAGNOSIS — Z8719 Personal history of other diseases of the digestive system: Secondary | ICD-10-CM | POA: Insufficient documentation

## 2013-11-20 DIAGNOSIS — G589 Mononeuropathy, unspecified: Secondary | ICD-10-CM | POA: Insufficient documentation

## 2013-11-20 DIAGNOSIS — F411 Generalized anxiety disorder: Secondary | ICD-10-CM | POA: Insufficient documentation

## 2013-11-20 DIAGNOSIS — F17200 Nicotine dependence, unspecified, uncomplicated: Secondary | ICD-10-CM | POA: Insufficient documentation

## 2013-11-20 DIAGNOSIS — E119 Type 2 diabetes mellitus without complications: Secondary | ICD-10-CM | POA: Insufficient documentation

## 2013-11-20 DIAGNOSIS — Z87448 Personal history of other diseases of urinary system: Secondary | ICD-10-CM | POA: Insufficient documentation

## 2013-11-20 DIAGNOSIS — R1084 Generalized abdominal pain: Secondary | ICD-10-CM | POA: Insufficient documentation

## 2013-11-20 DIAGNOSIS — IMO0001 Reserved for inherently not codable concepts without codable children: Secondary | ICD-10-CM | POA: Insufficient documentation

## 2013-11-20 LAB — COMPREHENSIVE METABOLIC PANEL
ALT: 18 U/L (ref 0–35)
AST: 21 U/L (ref 0–37)
Albumin: 4.7 g/dL (ref 3.5–5.2)
Alkaline Phosphatase: 93 U/L (ref 39–117)
BILIRUBIN TOTAL: 0.3 mg/dL (ref 0.3–1.2)
BUN: 17 mg/dL (ref 6–23)
CALCIUM: 10 mg/dL (ref 8.4–10.5)
CO2: 22 meq/L (ref 19–32)
CREATININE: 0.8 mg/dL (ref 0.50–1.10)
Chloride: 100 mEq/L (ref 96–112)
GFR calc Af Amer: >90 mL/min (ref >90)
GFR, EST NON AFRICAN AMERICAN: 84 mL/min — AB (ref >90)
GLUCOSE: 94 mg/dL (ref 70–99)
Potassium: 3.9 mEq/L (ref 3.7–5.3)
Sodium: 140 mEq/L (ref 137–147)
Total Protein: 8.3 g/dL (ref 6.0–8.3)

## 2013-11-20 LAB — CBC
HCT: 45.7 % (ref 36.0–46.0)
HEMOGLOBIN: 15.5 g/dL — AB (ref 12.0–15.0)
MCH: 31.1 pg (ref 26.0–34.0)
MCHC: 33.9 g/dL (ref 30.0–36.0)
MCV: 91.8 fL (ref 78.0–100.0)
PLATELETS: 286 10*3/uL (ref 150–400)
RBC: 4.98 MIL/uL (ref 3.87–5.11)
RDW: 14.5 % (ref 11.5–15.5)
WBC: 10.1 10*3/uL (ref 4.0–10.5)

## 2013-11-20 LAB — URINE MICROSCOPIC-ADD ON

## 2013-11-20 LAB — URINALYSIS, ROUTINE W REFLEX MICROSCOPIC
GLUCOSE, UA: NEGATIVE mg/dL
HGB URINE DIPSTICK: NEGATIVE
KETONES UR: 40 mg/dL — AB
LEUKOCYTES UA: NEGATIVE
Nitrite: NEGATIVE
PH: 5.5 (ref 5.0–8.0)
Protein, ur: 30 mg/dL — AB
Specific Gravity, Urine: >1.046 — ABNORMAL HIGH (ref 1.005–1.030)
Urobilinogen, UA: 1 mg/dL (ref 0.0–1.0)

## 2013-11-20 LAB — LIPASE, BLOOD: Lipase: 14 U/L (ref 11–59)

## 2013-11-20 MED ORDER — ONDANSETRON HCL 4 MG/2ML IJ SOLN
INTRAMUSCULAR | Status: AC
  Filled 2013-11-20: qty 2

## 2013-11-20 MED ORDER — PROMETHAZINE HCL 25 MG/ML IJ SOLN
25.0000 mg | Freq: Once | INTRAMUSCULAR | Status: AC
  Administered 2013-11-20: 25 mg via INTRAVENOUS
  Filled 2013-11-20: qty 1

## 2013-11-20 MED ORDER — HYDROMORPHONE HCL PF 1 MG/ML IJ SOLN
1.0000 mg | Freq: Once | INTRAMUSCULAR | Status: AC
  Administered 2013-11-20: 1 mg via INTRAVENOUS
  Filled 2013-11-20: qty 1

## 2013-11-20 MED ORDER — SODIUM CHLORIDE 0.9 % IV BOLUS (SEPSIS): 1000.0000 mL | Freq: Once | INTRAVENOUS | Status: DC

## 2013-11-20 MED ORDER — SODIUM CHLORIDE 0.9 % IV BOLUS (SEPSIS)
1000.0000 mL | Freq: Once | INTRAVENOUS | Status: AC
  Administered 2013-11-20: 1000 mL via INTRAVENOUS

## 2013-11-20 MED ORDER — PROMETHAZINE HCL 25 MG PO TABS: 25.0000 mg | ORAL_TABLET | Freq: Four times a day (QID) | ORAL | Status: AC | PRN

## 2013-11-20 NOTE — Discharge Instructions (Signed)
Return to the ED with any concerns including fever/chills, vomiting and not able to keep down liquids, abdominal pain, decreased level of alertness/lethargy, or any other alarming symptoms

## 2013-11-20 NOTE — ED Provider Notes (Signed)
CSN: [id number]     Arrival date & time 11/20/13  1736 History  This chart was scribed for Threasa Beards, MD by Elby Beck, ED Scribe. This patient was seen in room MHT13/MHT13 and the patient's care was started at 7:43 PM.    Chief Complaint  Patient presents with  . Headache    Patient is a 53 y.o. female presenting with headaches. The history is provided by the patient. No language interpreter was used.  Headache Pain location:  Generalized Quality: "pounding" Radiates to:  Does not radiate Severity currently:  Unable to specify Severity at highest:  Unable to specify Onset quality:  Gradual Duration:  1 day Timing:  Constant Progression:  Waxing and waning Chronicity:  New Context comment:  Vomiting and diarrhea over the past week, awoke from sleep this morning with headache Relieved by:  None tried Worsened by:  Nothing tried Ineffective treatments:  None tried Associated symptoms: congestion, diarrhea and vomiting     HPI Comments: Kimberly Sharp is a 53 y.o. female with a history of CA and renal disorder who presents to the Emergency Department complaining of a waxing and waning "pounding" headache which woke her from sleep this morning at 4:30 AM. She states that she has been having associated episodes of emesis and diarrhea over the past week. She states that she has also been having intermittent abdominal pain as well, which is mainly present just prior having episodes of emesis. She states that she has not been able to keep down any food over the past 2 days and that she has only been able to keep down about 12 oz.of fluid over the past 2 days. She reports that she has urinated twice today. She also states that she has had some nasal congestion recently. She states that she was seen by her PCP this morning for the current symptoms, where she was given an injection of Phenergan. She also states that she was given a prescription which she was not able to fill.   Past  Medical History  Diagnosis Date  . Anxiety   . Renal disorder   . Diabetes mellitus   . Cancer   . Fibromyalgia   . Neuropathy   . Hiatal hernia   . Osteoporosis   . Back pain   . Chronic pain   . Interstitial cystitis    Past Surgical History  Procedure Laterality Date  . Tonsillectomy    . Abdominal hysterectomy    . Cholecystectomy    . Tubal ligation    . Tracheostomy    . Cesarean section    . Knee surgery    . Appendectomy     Family History  Problem Relation Age of Onset  . Adopted: Yes   History  Substance Use Topics  . Smoking status: Current Every Day Smoker -- 0.25 packs/day    Types: Cigarettes  . Smokeless tobacco: Not on file  . Alcohol Use: No   OB History   Grav Para Term Preterm Abortions TAB SAB Ect Mult Living                 Review of Systems  HENT: Positive for congestion.   Gastrointestinal: Positive for vomiting and diarrhea.  Neurological: Positive for headaches.  All other systems reviewed and are negative.   Allergies  Aspirin; Ibuprofen; Metformin and related; Reglan; Seroquel; Trazodone and nefazodone; Unasyn; and Zofran  Home Medications   Current Outpatient Rx  Name  Route  Sig  Dispense  Refill  . acetaminophen (TYLENOL) 325 MG tablet   Oral   Take 650 mg by mouth every 6 (six) hours as needed.         Marland Kitchen albuterol (PROVENTIL HFA;VENTOLIN HFA) 108 (90 BASE) MCG/ACT inhaler   Inhalation   Inhale 2 puffs into the lungs every 6 (six) hours as needed for wheezing.         . benzonatate (TESSALON) 100 MG capsule   Oral   Take 1 capsule (100 mg total) by mouth every 8 (eight) hours.   21 capsule   0   . furosemide (LASIX) 40 MG tablet   Oral   Take 40 mg by mouth as needed. Fluid         . gabapentin (NEURONTIN) 800 MG tablet   Oral   Take 800 mg by mouth 3 (three) times daily.         Marland Kitchen LORazepam (ATIVAN) 0.5 MG tablet   Oral   Take 1 tablet (0.5 mg total) by mouth as needed for anxiety.   7 tablet   0    . oxycodone (OXY-IR) 5 MG capsule   Oral   Take 5 mg by mouth every 4 (four) hours as needed.         . promethazine (PHENERGAN) 25 MG tablet   Oral   Take 1 tablet (25 mg total) by mouth every 6 (six) hours as needed for nausea.   20 tablet   0   . TiZANidine HCl (ZANAFLEX PO)   Oral   Take by mouth.         . venlafaxine XR (EFFEXOR-XR) 37.5 MG 24 hr capsule   Oral   Take 1 capsule (37.5 mg total) by mouth daily with breakfast. Take one capsule daily.   30 capsule   1    Triage Vitals: BP 161/106  Pulse 108  Temp(Src) 98.5 F (36.9 C) (Oral)  Resp 18  Ht 5' (1.524 m)  Wt 191 lb (86.637 kg)  BMI 37.30 kg/m2  SpO2 100%  Physical Exam  Nursing note and vitals reviewed. Constitutional: She is oriented to person, place, and time. She appears well-developed and well-nourished. No distress.  HENT:  Head: Normocephalic and atraumatic.  Mucous membranes are tacky  Eyes: EOM are normal.  Neck: Neck supple. No tracheal deviation present.  Cardiovascular: Normal rate.   Pulmonary/Chest: Effort normal. No respiratory distress.  Abdominal: Soft. Bowel sounds are normal. There is tenderness (Diffuse mild tenderness). There is no rebound and no guarding.  Musculoskeletal: Normal range of motion.  Neurological: She is alert and oriented to person, place, and time.  Skin: Skin is warm and dry.  Psychiatric: She has a normal mood and affect. Her behavior is normal.   ED Course  Procedures (including critical care time)  DIAGNOSTIC STUDIES: Oxygen Saturation is 100% on RA, normal by my interpretation.    COORDINATION OF CARE: 7:49 PM- Discussed plan to obtain diagnostic lab work. Will also order Phenergan and IV fluids. Pt advised of plan for treatment and pt agrees.  9:40 PM on recheck pt has been able to drink fluids, vitals are improved.    Medications  ondansetron (ZOFRAN) 4 MG/2ML injection (  Not Given 11/20/13 1855)  sodium chloride 0.9 % bolus 1,000 mL (not  administered)  promethazine (PHENERGAN) injection 25 mg (25 mg Intravenous Given 11/20/13 2017)  sodium chloride 0.9 % bolus 1,000 mL (1,000 mLs Intravenous New Bag/Given 11/20/13 2017)  HYDROmorphone (DILAUDID) injection 1  mg (1 mg Intravenous Given 11/20/13 2050)   Labs Review Labs Reviewed  URINALYSIS, ROUTINE W REFLEX MICROSCOPIC - Abnormal; Notable for the following:    Specific Gravity, Urine >1.046 (*)    Bilirubin Urine MODERATE (*)    Ketones, ur 40 (*)    Protein, ur 30 (*)    All other components within normal limits  URINE MICROSCOPIC-ADD ON - Abnormal; Notable for the following:    Squamous Epithelial / LPF FEW (*)    Bacteria, UA MANY (*)    All other components within normal limits  CBC - Abnormal; Notable for the following:    Hemoglobin 15.5 (*)    All other components within normal limits  COMPREHENSIVE METABOLIC PANEL - Abnormal; Notable for the following:    GFR calc non Af Amer 84 (*)    All other components within normal limits  LIPASE, BLOOD   Imaging Review No results found.   EKG Interpretation None      MDM   Final diagnoses:  Vomiting and diarrhea    Pt presenting with multiple complaints.  Primarily vomiting and diarrhea.  Abdominal exam is reassuring, diffuse mild tendenress, but no guaurding or rebound, no distension, nabs.  Labs are reassiruing as well including urinalysis.  Pt requesting dialudid and ativan after antiemetics.  She was treated with antiemetics and pain control.  No indication found for emergent imaging, doubt appendicitis, SBO or other emergent issue at this time.  Pt is tolerating po fluids in the ED.  Discharged with strict return precautions.  Pt agreeable with plan.  I personally performed the services described in this documentation, which was scribed in my presence. The recorded information has been reviewed and is accurate.   Threasa Beards, MD 11/24/13 [phone]

## 2013-11-20 NOTE — ED Notes (Addendum)
Headache since this am. Vomiting and diarrhea x 2 days. She was seen by her MD this AM and given an injection of Phenergan and a Rx that she was unable to get.

## 2014-01-31 ENCOUNTER — Emergency Department (HOSPITAL_BASED_OUTPATIENT_CLINIC_OR_DEPARTMENT_OTHER)
Admission: EM | Admit: 2014-01-31 | Discharge: 2014-02-01 | Disposition: A | Discharge status: Home / Self Care | Payer: Medicare Other | Attending: Emergency Medicine | Admitting: Emergency Medicine

## 2014-01-31 ENCOUNTER — Encounter (HOSPITAL_BASED_OUTPATIENT_CLINIC_OR_DEPARTMENT_OTHER): Payer: Self-pay | Admitting: Emergency Medicine

## 2014-01-31 DIAGNOSIS — M545 Low back pain, unspecified: Secondary | ICD-10-CM | POA: Insufficient documentation

## 2014-01-31 DIAGNOSIS — E119 Type 2 diabetes mellitus without complications: Secondary | ICD-10-CM | POA: Insufficient documentation

## 2014-01-31 DIAGNOSIS — Z87448 Personal history of other diseases of urinary system: Secondary | ICD-10-CM | POA: Insufficient documentation

## 2014-01-31 DIAGNOSIS — Z8719 Personal history of other diseases of the digestive system: Secondary | ICD-10-CM | POA: Insufficient documentation

## 2014-01-31 DIAGNOSIS — G8929 Other chronic pain: Secondary | ICD-10-CM | POA: Insufficient documentation

## 2014-01-31 DIAGNOSIS — Z79899 Other long term (current) drug therapy: Secondary | ICD-10-CM | POA: Insufficient documentation

## 2014-01-31 DIAGNOSIS — F411 Generalized anxiety disorder: Secondary | ICD-10-CM | POA: Insufficient documentation

## 2014-01-31 DIAGNOSIS — M549 Dorsalgia, unspecified: Secondary | ICD-10-CM

## 2014-01-31 DIAGNOSIS — F17200 Nicotine dependence, unspecified, uncomplicated: Secondary | ICD-10-CM | POA: Insufficient documentation

## 2014-01-31 DIAGNOSIS — Z859 Personal history of malignant neoplasm, unspecified: Secondary | ICD-10-CM | POA: Insufficient documentation

## 2014-01-31 MED ORDER — HYDROMORPHONE HCL PF 2 MG/ML IJ SOLN
2.0000 mg | Freq: Once | INTRAMUSCULAR | Status: AC
  Administered 2014-01-31: 2 mg via INTRAMUSCULAR
  Filled 2014-01-31: qty 1

## 2014-01-31 NOTE — ED Notes (Signed)
Experiencing exacerbation of chronic back pain.  C/o radiation down right hip and into her legs.  Pain started after laying in a water bed.

## 2014-02-01 LAB — URINALYSIS, ROUTINE W REFLEX MICROSCOPIC
Glucose, UA: NEGATIVE mg/dL
HGB URINE DIPSTICK: NEGATIVE
KETONES UR: NEGATIVE mg/dL
Nitrite: NEGATIVE
PROTEIN: NEGATIVE mg/dL
Specific Gravity, Urine: 1.029 (ref 1.005–1.030)
UROBILINOGEN UA: 0.2 mg/dL (ref 0.0–1.0)
pH: 6 (ref 5.0–8.0)

## 2014-02-01 LAB — URINE MICROSCOPIC-ADD ON

## 2014-02-01 MED ORDER — HYDROMORPHONE HCL PF 1 MG/ML IJ SOLN
1.0000 mg | Freq: Once | INTRAMUSCULAR | Status: AC
  Administered 2014-02-01: 1 mg via INTRAMUSCULAR
  Filled 2014-02-01: qty 1

## 2014-02-01 NOTE — ED Notes (Signed)
Dilaudid IM given. Pt on Med HOLD

## 2014-02-01 NOTE — Discharge Instructions (Signed)
Back Pain, Adult Low back pain is very common. About 1 in 5 people have back pain.The cause of low back pain is rarely dangerous. The pain often gets better over time.About half of people with a sudden onset of back pain feel better in just 2 weeks. About 8 in 10 people feel better by 6 weeks.  CAUSES Some common causes of back pain include:  Strain of the muscles or ligaments supporting the spine.  Wear and tear (degeneration) of the spinal discs.  Arthritis.  Direct injury to the back. DIAGNOSIS Most of the time, the direct cause of low back pain is not known.However, back pain can be treated effectively even when the exact cause of the pain is unknown.Answering your caregiver's questions about your overall health and symptoms is one of the most accurate ways to make sure the cause of your pain is not dangerous. If your caregiver needs more information, he or she may order lab work or imaging tests (X-rays or MRIs).However, even if imaging tests show changes in your back, this usually does not require surgery. HOME CARE INSTRUCTIONS For many people, back pain returns.Since low back pain is rarely dangerous, it is often a condition that people can learn to manageon their own.   Remain active. It is stressful on the back to sit or stand in one place. Do not sit, drive, or stand in one place for more than 30 minutes at a time. Take short walks on level surfaces as soon as pain allows.Try to increase the length of time you walk each day.  Do not stay in bed.Resting more than 1 or 2 days can delay your recovery.  Do not avoid exercise or work.Your body is made to move.It is not dangerous to be active, even though your back may hurt.Your back will likely heal faster if you return to being active before your pain is gone.  Pay attention to your body when you bend and lift. Many people have less discomfortwhen lifting if they bend their knees, keep the load close to their bodies,and  avoid twisting. Often, the most comfortable positions are those that put less stress on your recovering back.  Find a comfortable position to sleep. Use a firm mattress and lie on your side with your knees slightly bent. If you lie on your back, put a pillow under your knees.  Only take over-the-counter or prescription medicines as directed by your caregiver. Over-the-counter medicines to reduce pain and inflammation are often the most helpful.Your caregiver may prescribe muscle relaxant drugs.These medicines help dull your pain so you can more quickly return to your normal activities and healthy exercise.  Put ice on the injured area.  Put ice in a plastic bag.  Place a towel between your skin and the bag.  Leave the ice on for 15-20 minutes, 03-04 times a day for the first 2 to 3 days. After that, ice and heat may be alternated to reduce pain and spasms.  Ask your caregiver about trying back exercises and gentle massage. This may be of some benefit.  Avoid feeling anxious or stressed.Stress increases muscle tension and can worsen back pain.It is important to recognize when you are anxious or stressed and learn ways to manage it.Exercise is a great option. SEEK MEDICAL CARE IF:  You have pain that is not relieved with rest or medicine.  You have pain that does not improve in 1 week.  You have new symptoms.  You are generally not feeling well. SEEK   IMMEDIATE MEDICAL CARE IF:   You have pain that radiates from your back into your legs.  You develop new bowel or bladder control problems.  You have unusual weakness or numbness in your arms or legs.  You develop nausea or vomiting.  You develop abdominal pain.  You feel faint. Document Released: 08/28/2005 Document Revised: 02/27/2012 Document Reviewed: 01/16/2011 [employer] Patient Information 2014 [employer].  

## 2014-02-01 NOTE — ED Provider Notes (Signed)
CSN: [id number]     Arrival date & time 01/31/14  1926 History   First MD Initiated Contact with Patient 01/31/14 2203     Chief Complaint  Patient presents with  . Back Pain     (Consider location/radiation/quality/duration/timing/severity/associated sxs/prior Treatment) Patient is a 53 y.o. female presenting with back pain. The history is provided by the patient. No language interpreter was used.  Back Pain Location:  Lumbar spine Associated symptoms: no dysuria, no fever, no headaches and no numbness   Associated symptoms comment:  Patient with chronic back pain, under Pain Management, who joined a gym recently and started treadmill walking. She has had increasing, severe back pain in typical location and distribution as her chronic pain since using a hydrotherapy bed which she says had high pressure jets. Pain increasing over 4 days. No urinary symptoms, abdominal pain, fever, vomiting. She does report having diarrhea for the last 2 days that is non-bloody.   Past Medical History  Diagnosis Date  . Anxiety   . Renal disorder   . Diabetes mellitus   . Cancer   . Fibromyalgia   . Neuropathy   . Hiatal hernia   . Osteoporosis   . Back pain   . Chronic pain   . Interstitial cystitis    Past Surgical History  Procedure Laterality Date  . Tonsillectomy    . Abdominal hysterectomy    . Cholecystectomy    . Tubal ligation    . Tracheostomy    . Cesarean section    . Knee surgery    . Appendectomy     Family History  Problem Relation Age of Onset  . Adopted: Yes   History  Substance Use Topics  . Smoking status: Current Every Day Smoker -- 0.25 packs/day    Types: Cigarettes  . Smokeless tobacco: Not on file  . Alcohol Use: No   OB History   Grav Para Term Preterm Abortions TAB SAB Ect Mult Living                 Review of Systems  Constitutional: Negative for fever and chills.  Gastrointestinal: Negative.  Negative for nausea.  Genitourinary: Negative.   Negative for dysuria.  Musculoskeletal: Positive for back pain.  Skin: Negative.   Neurological: Negative.  Negative for numbness and headaches.      Allergies  Aspirin; Ibuprofen; Metformin and related; Reglan; Seroquel; Trazodone and nefazodone; Unasyn; and Zofran  Home Medications   Prior to Admission medications   Medication Sig Start Date End Date Taking? Authorizing Provider  acetaminophen (TYLENOL) 325 MG tablet Take 650 mg by mouth every 6 (six) hours as needed.    Historical Provider, MD  albuterol (PROVENTIL HFA;VENTOLIN HFA) 108 (90 BASE) MCG/ACT inhaler Inhale 2 puffs into the lungs every 6 (six) hours as needed for wheezing.    Historical Provider, MD  benzonatate (TESSALON) 100 MG capsule Take 1 capsule (100 mg total) by mouth every 8 (eight) hours. 07/22/13   Dorie Rank, MD  furosemide (LASIX) 40 MG tablet Take 40 mg by mouth as needed. Fluid 05/19/13   Historical Provider, MD  gabapentin (NEURONTIN) 800 MG tablet Take 800 mg by mouth 3 (three) times daily.    Historical Provider, MD  LORazepam (ATIVAN) 0.5 MG tablet Take 1 tablet (0.5 mg total) by mouth as needed for anxiety. 07/25/13   Waldon Merl, MD  oxycodone (OXY-IR) 5 MG capsule Take 5 mg by mouth every 4 (four) hours as needed.  Historical Provider, MD  promethazine (PHENERGAN) 25 MG tablet Take 1 tablet (25 mg total) by mouth every 6 (six) hours as needed for nausea. 12/16/12   Mylinda Latina III, MD  promethazine (PHENERGAN) 25 MG tablet Take 1 tablet (25 mg total) by mouth every 6 (six) hours as needed for nausea or vomiting. 11/20/13   Threasa Beards, MD  TiZANidine HCl (ZANAFLEX PO) Take by mouth.    Historical Provider, MD  venlafaxine XR (EFFEXOR-XR) 37.5 MG 24 hr capsule Take 1 capsule (37.5 mg total) by mouth daily with breakfast. Take one capsule daily. 07/24/13   Waldon Merl, MD   BP 109/73  Pulse 116  Temp(Src) 98.4 F (36.9 C) (Oral)  Resp 20  Ht 5' (1.524 m)  Wt 184 lb (83.462 kg)  BMI  35.94 kg/m2  SpO2 99% Physical Exam  Constitutional: She appears well-developed and well-nourished.  HENT:  Head: Normocephalic.  Neck: Normal range of motion. Neck supple.  Cardiovascular: Normal rate and regular rhythm.   Pulmonary/Chest: Effort normal and breath sounds normal.  Abdominal: Soft. Bowel sounds are normal. There is no tenderness. There is no rebound and no guarding.  Musculoskeletal: Normal range of motion.  Mild lumbar and paralumbar tenderness without swelling or discoloration.  Neurological: She is alert. She has normal reflexes. Coordination normal.  Skin: Skin is warm and dry. No rash noted.  Psychiatric: She has a normal mood and affect.    ED Course  Procedures (including critical care time) Labs Review Labs Reviewed  URINALYSIS, ROUTINE W REFLEX MICROSCOPIC - Abnormal; Notable for the following:    Bilirubin Urine SMALL (*)    Leukocytes, UA SMALL (*)    All other components within normal limits  URINE CULTURE  URINE MICROSCOPIC-ADD ON    Imaging Review No results found.   EKG Interpretation None      MDM   Final diagnoses:  Back pain   Patient with chronic back pain presenting with typical symptoms of flare. Pain is improved in ED with IM medications. Patient readily admits to involvement with PainManagement and does not want prescriptions for medications. No neurologic deficits or red flags. Stable for discharge.     Dewaine Oats, PA-C 02/01/14 0028

## 2014-02-01 NOTE — ED Provider Notes (Signed)
Medical screening examination/treatment/procedure(s) were performed by non-physician practitioner and as supervising physician I was immediately available for consultation/collaboration.   EKG Interpretation None        Malvin Johns, MD 02/01/14 1546

## 2014-02-02 LAB — URINE CULTURE: Colony Count: 5000

## 2014-04-06 ENCOUNTER — Emergency Department (HOSPITAL_BASED_OUTPATIENT_CLINIC_OR_DEPARTMENT_OTHER)
Admission: EM | Admit: 2014-04-06 | Discharge: 2014-04-06 | Disposition: A | Discharge status: Home / Self Care | Payer: Medicare Other | Attending: Emergency Medicine | Admitting: Emergency Medicine

## 2014-04-06 ENCOUNTER — Encounter (HOSPITAL_BASED_OUTPATIENT_CLINIC_OR_DEPARTMENT_OTHER): Payer: Self-pay | Admitting: Emergency Medicine

## 2014-04-06 DIAGNOSIS — Z8669 Personal history of other diseases of the nervous system and sense organs: Secondary | ICD-10-CM | POA: Diagnosis not present

## 2014-04-06 DIAGNOSIS — Z859 Personal history of malignant neoplasm, unspecified: Secondary | ICD-10-CM | POA: Insufficient documentation

## 2014-04-06 DIAGNOSIS — G8929 Other chronic pain: Secondary | ICD-10-CM | POA: Diagnosis not present

## 2014-04-06 DIAGNOSIS — B9689 Other specified bacterial agents as the cause of diseases classified elsewhere: Secondary | ICD-10-CM | POA: Insufficient documentation

## 2014-04-06 DIAGNOSIS — Z9089 Acquired absence of other organs: Secondary | ICD-10-CM | POA: Insufficient documentation

## 2014-04-06 DIAGNOSIS — Z9851 Tubal ligation status: Secondary | ICD-10-CM | POA: Diagnosis not present

## 2014-04-06 DIAGNOSIS — A499 Bacterial infection, unspecified: Secondary | ICD-10-CM | POA: Diagnosis not present

## 2014-04-06 DIAGNOSIS — Z90710 Acquired absence of both cervix and uterus: Secondary | ICD-10-CM | POA: Insufficient documentation

## 2014-04-06 DIAGNOSIS — K5900 Constipation, unspecified: Secondary | ICD-10-CM | POA: Insufficient documentation

## 2014-04-06 DIAGNOSIS — Z8739 Personal history of other diseases of the musculoskeletal system and connective tissue: Secondary | ICD-10-CM | POA: Diagnosis not present

## 2014-04-06 DIAGNOSIS — N3010 Interstitial cystitis (chronic) without hematuria: Secondary | ICD-10-CM | POA: Insufficient documentation

## 2014-04-06 DIAGNOSIS — N760 Acute vaginitis: Secondary | ICD-10-CM | POA: Diagnosis not present

## 2014-04-06 DIAGNOSIS — Z79899 Other long term (current) drug therapy: Secondary | ICD-10-CM | POA: Diagnosis not present

## 2014-04-06 DIAGNOSIS — F17200 Nicotine dependence, unspecified, uncomplicated: Secondary | ICD-10-CM | POA: Insufficient documentation

## 2014-04-06 DIAGNOSIS — Z9889 Other specified postprocedural states: Secondary | ICD-10-CM | POA: Diagnosis not present

## 2014-04-06 DIAGNOSIS — E119 Type 2 diabetes mellitus without complications: Secondary | ICD-10-CM | POA: Insufficient documentation

## 2014-04-06 DIAGNOSIS — L293 Anogenital pruritus, unspecified: Secondary | ICD-10-CM | POA: Diagnosis present

## 2014-04-06 DIAGNOSIS — F411 Generalized anxiety disorder: Secondary | ICD-10-CM | POA: Insufficient documentation

## 2014-04-06 LAB — WET PREP, GENITAL
Trich, Wet Prep: NONE SEEN
Yeast Wet Prep HPF POC: NONE SEEN

## 2014-04-06 LAB — URINE MICROSCOPIC-ADD ON

## 2014-04-06 LAB — URINALYSIS, ROUTINE W REFLEX MICROSCOPIC
Bilirubin Urine: NEGATIVE
GLUCOSE, UA: NEGATIVE mg/dL
Hgb urine dipstick: NEGATIVE
Ketones, ur: NEGATIVE mg/dL
Nitrite: NEGATIVE
PH: 5.5 (ref 5.0–8.0)
Protein, ur: NEGATIVE mg/dL
Specific Gravity, Urine: 1.038 — ABNORMAL HIGH (ref 1.005–1.030)
Urobilinogen, UA: 0.2 mg/dL (ref 0.0–1.0)

## 2014-04-06 MED ORDER — HYDROCODONE-ACETAMINOPHEN 5-325 MG PO TABS: 1.0000 | ORAL_TABLET | Freq: Four times a day (QID) | ORAL | Status: AC | PRN

## 2014-04-06 MED ORDER — DIPHENHYDRAMINE HCL 50 MG/ML IJ SOLN
25.0000 mg | Freq: Once | INTRAMUSCULAR | Status: AC
  Administered 2014-04-06: 25 mg via INTRAVENOUS

## 2014-04-06 MED ORDER — DIPHENHYDRAMINE HCL 50 MG/ML IJ SOLN
INTRAMUSCULAR | Status: AC
  Filled 2014-04-06: qty 1

## 2014-04-06 MED ORDER — HYDROMORPHONE HCL PF 1 MG/ML IJ SOLN
1.0000 mg | Freq: Once | INTRAMUSCULAR | Status: AC
  Administered 2014-04-06: 1 mg via INTRAMUSCULAR
  Filled 2014-04-06: qty 1

## 2014-04-06 MED ORDER — MORPHINE SULFATE 4 MG/ML IJ SOLN
4.0000 mg | Freq: Once | INTRAMUSCULAR | Status: AC
  Administered 2014-04-06: 4 mg via INTRAMUSCULAR
  Filled 2014-04-06: qty 1

## 2014-04-06 MED ORDER — METRONIDAZOLE 500 MG PO TABS: 500.0000 mg | ORAL_TABLET | Freq: Two times a day (BID) | ORAL | Status: AC

## 2014-04-06 NOTE — ED Provider Notes (Signed)
CSN: [id number]     Arrival date & time 04/06/14  1722 History   First MD Initiated Contact with Patient 04/06/14 1841     Chief Complaint  Patient presents with  . Vaginal Itching     (Consider location/radiation/quality/duration/timing/severity/associated sxs/prior Treatment) HPI Comments: Kimberly Sharp is a 53 y.o. Female with a PSHx of appendectomy, total hysterectomy, endometriosis, and interstitial cystitis who presents today with complaints of 8 days of vaginal irritation and dysuria, similar to her past symptoms from her interstitial cystitis. She states that she has had a burning sensation when she urinates and noticed she has cloudier/darker urine, but she denies any hematuria, vaginal discharge, vaginal itching, or vaginal bleeding. Endorses lower abd "pressure", moderate in severity, radiating into her low back/tailbone, constant. Denies any recent sexual intercourse. States she's been trying to wash herself more frequently, and using ice packs to help with the vaginal irritation, and that tylenol and motrin have not helped the pain. Endorses some N/V intermittently, related to the pain, but has been drinking fluids and tolerating them well. Denies sexual intercourse recently. Has chronic constipation but endorses moving her bowels well recently and still passing gas. Denies any flank pain, fevers/chills, CP, SOB, LE paresthesias, cauda equina symptoms, myalgias or arthralgias.   Patient is a 53 y.o. female presenting with abdominal pain. The history is provided by the patient. No language interpreter was used.  Abdominal Pain Pain location:  Suprapubic Pain quality: pressure   Pain radiates to:  Back Pain severity:  Moderate Onset quality:  Gradual Duration:  8 days Timing:  Constant Progression:  Unchanged Chronicity:  Recurrent Relieved by:  Cold Worsened by:  Urination Ineffective treatments:  Acetaminophen, NSAIDs and bowel activity Associated symptoms: constipation  (chronic), dysuria, nausea (intermittent) and vomiting (once)   Associated symptoms: no anorexia, no chest pain, no chills, no diarrhea, no fever, no flatus, no hematemesis, no hematochezia, no hematuria, no melena, no shortness of breath, no vaginal bleeding and no vaginal discharge     Past Medical History  Diagnosis Date  . Anxiety   . Renal disorder   . Diabetes mellitus   . Cancer   . Fibromyalgia   . Neuropathy   . Hiatal hernia   . Osteoporosis   . Back pain   . Chronic pain   . Interstitial cystitis    Past Surgical History  Procedure Laterality Date  . Tonsillectomy    . Abdominal hysterectomy    . Cholecystectomy    . Tubal ligation    . Tracheostomy    . Cesarean section    . Knee surgery    . Appendectomy     Family History  Problem Relation Age of Onset  . Adopted: Yes   History  Substance Use Topics  . Smoking status: Current Every Day Smoker -- 0.25 packs/day    Types: Cigarettes  . Smokeless tobacco: Not on file  . Alcohol Use: No   OB History   Grav Para Term Preterm Abortions TAB SAB Ect Mult Living                 Review of Systems  Constitutional: Negative for fever and chills.  Respiratory: Negative for shortness of breath.   Cardiovascular: Negative for chest pain.  Gastrointestinal: Positive for nausea (intermittent), vomiting (once), abdominal pain and constipation (chronic). Negative for diarrhea, blood in stool, melena, hematochezia, abdominal distention, rectal pain, anorexia, flatus and hematemesis.  Genitourinary: Positive for dysuria, decreased urine volume, difficulty urinating, vaginal pain  and pelvic pain. Negative for hematuria, flank pain, vaginal bleeding and vaginal discharge.  Musculoskeletal: Negative for arthralgias and myalgias.  Skin: Negative for color change.  Neurological: Negative for dizziness, weakness and numbness.  Psychiatric/Behavioral: Negative for confusion.  10 Systems reviewed and are negative for acute  change except as noted in the HPI.     Allergies  Aspirin; Ibuprofen; Metformin and related; Reglan; Seroquel; Trazodone and nefazodone; Unasyn; and Zofran  Home Medications   Prior to Admission medications   Medication Sig Start Date End Date Taking? Authorizing Provider  acetaminophen (TYLENOL) 325 MG tablet Take 650 mg by mouth every 6 (six) hours as needed.    Historical Provider, MD  albuterol (PROVENTIL HFA;VENTOLIN HFA) 108 (90 BASE) MCG/ACT inhaler Inhale 2 puffs into the lungs every 6 (six) hours as needed for wheezing.    Historical Provider, MD  benzonatate (TESSALON) 100 MG capsule Take 1 capsule (100 mg total) by mouth every 8 (eight) hours. 07/22/13   Dorie Rank, MD  furosemide (LASIX) 40 MG tablet Take 40 mg by mouth as needed. Fluid 05/19/13   Historical Provider, MD  gabapentin (NEURONTIN) 800 MG tablet Take 800 mg by mouth 3 (three) times daily.    Historical Provider, MD  HYDROcodone-acetaminophen (NORCO) 5-325 MG per tablet Take 1-2 tablets by mouth every 6 (six) hours as needed for severe pain. 04/06/14   Latorya Bautch Strupp Camprubi-Soms, PA-C  LORazepam (ATIVAN) 0.5 MG tablet Take 1 tablet (0.5 mg total) by mouth as needed for anxiety. 07/25/13   Waldon Merl, MD  metroNIDAZOLE (FLAGYL) 500 MG tablet Take 1 tablet (500 mg total) by mouth 2 (two) times daily. One po bid x 7 days 04/06/14   Patty Sermons Camprubi-Soms, PA-C  oxycodone (OXY-IR) 5 MG capsule Take 5 mg by mouth every 4 (four) hours as needed.    Historical Provider, MD  promethazine (PHENERGAN) 25 MG tablet Take 1 tablet (25 mg total) by mouth every 6 (six) hours as needed for nausea. 12/16/12   Mylinda Latina III, MD  promethazine (PHENERGAN) 25 MG tablet Take 1 tablet (25 mg total) by mouth every 6 (six) hours as needed for nausea or vomiting. 11/20/13   Threasa Beards, MD  TiZANidine HCl (ZANAFLEX PO) Take by mouth.    Historical Provider, MD  venlafaxine XR (EFFEXOR-XR) 37.5 MG 24 hr capsule Take 1 capsule  (37.5 mg total) by mouth daily with breakfast. Take one capsule daily. 07/24/13   Waldon Merl, MD   BP 140/94  Pulse 102  Temp(Src) 98.2 F (36.8 C) (Oral)  Resp 16  Ht 5' (1.524 m)  Wt 174 lb (78.926 kg)  BMI 33.98 kg/m2  SpO2 100% Physical Exam  Nursing note and vitals reviewed. Constitutional: She is oriented to person, place, and time. Vital signs are normal. She appears well-developed and well-nourished. No distress.  Afebrile, nontoxic  HENT:  Head: Normocephalic and atraumatic.  Mouth/Throat: Mucous membranes are normal.  Eyes: Conjunctivae and EOM are normal. Right eye exhibits no discharge. Left eye exhibits no discharge.  Neck: Normal range of motion. Neck supple.  Cardiovascular: Normal rate, regular rhythm, normal heart sounds and intact distal pulses.   No murmur heard. Pulmonary/Chest: Effort normal and breath sounds normal. She has no wheezes. She has no rales.  Abdominal: Soft. Normal appearance and bowel sounds are normal. She exhibits no distension. There is tenderness in the suprapubic area. There is no rigidity, no rebound, no guarding, no CVA tenderness, no tenderness at McBurney's point  and negative Murphy's sign.  Soft, +BS throughout, nondistended, TTP in suprapubic region, no r/g/r, no mcburney's point TTP, neg murphy's. No CVA ttp bilaterally  Genitourinary: Pelvic exam was performed with patient supine. There is no rash, tenderness or lesion on the right labia. There is no rash, tenderness or lesion on the left labia. There is erythema and tenderness around the vagina. Vaginal discharge (minimal, mucoid) found.  Limited pelvic exam performed, digital exam only given that pt has had a complete hysterectomy. Minimal mucoid vaginal discharge in vaginal vault, minimal erythema, and moderate tenderness with digital exam. No vaginal bleeding. Swab obtained for wet prep  Musculoskeletal: Normal range of motion.       Lumbar back: She exhibits pain.  Mild pain in  lumbar/gluteal region in the paraspinous muscles, no midline TTP or deformity. FROM intact. No spasms. Strength 5/5 in all extremities, sensation grossly intact.  Neurological: She is alert and oriented to person, place, and time. She has normal strength. No sensory deficit.  Strength 5/5 in all extremities, sensation grossly intact.  Skin: Skin is warm, dry and intact. No rash noted.  Psychiatric: She has a normal mood and affect.    ED Course  Procedures (including critical care time) Labs Review Labs Reviewed  WET PREP, GENITAL - Abnormal; Notable for the following:    Clue Cells Wet Prep HPF POC MODERATE (*)    WBC, Wet Prep HPF POC FEW (*)    All other components within normal limits  URINALYSIS, ROUTINE W REFLEX MICROSCOPIC - Abnormal; Notable for the following:    APPearance CLOUDY (*)    Specific Gravity, Urine 1.038 (*)    Leukocytes, UA TRACE (*)    All other components within normal limits  URINE MICROSCOPIC-ADD ON - Abnormal; Notable for the following:    Squamous Epithelial / LPF FEW (*)    Bacteria, UA FEW (*)    Crystals CA OXALATE CRYSTALS (*)    All other components within normal limits    Imaging Review No results found.   EKG Interpretation None      MDM   Final diagnoses:  BV (bacterial vaginosis)  Interstitial cystitis    Kimberly Sharp is a 53 y.o. female with a PMHx of interstitial cystitis, PSHx of total hysterectomy, appendectomy, and cholecystectomy presenting for dysuria and vaginal irritation. Pelvic revealed some vaginal erythema and discharge, limited pelvic performed given that pt has had a complete hysterectomy therefore no concern for PID, ovarian torsion, or other reproductive organ concern. Wet prep found BV. U/A reveals few bacteria and trace leuks but 0-2 WBC therefore I believe this is consistent with BV as well as interstitial cystitis, and doubt UTI. Doubt kidney stone or pyelo. Pt has appt with urology set up for this week. Will rx  metronidazole and percocet and have her f/up with urology. I explained the diagnosis and have given explicit precautions to return to the ER including for any other new or worsening symptoms. The patient understands and accepts the medical plan as it's been dictated and I have answered their questions. Discharge instructions concerning home care and prescriptions have been given. The patient is STABLE and is discharged to home in good condition.  BP 140/94  Pulse 102  Temp(Src) 98.2 F (36.8 C) (Oral)  Resp 16  Ht 5' (1.524 m)  Wt 174 lb (78.926 kg)  BMI 33.98 kg/m2  SpO2 100%     [employer], PA-C 04/06/14 2115

## 2014-04-06 NOTE — ED Notes (Addendum)
Pt c/o vaginal irritation x 3 days appt with PMD sched for wed, Urology for fri

## 2014-04-06 NOTE — Discharge Instructions (Signed)
Your labs showed that you have bacterial vaginosis, for which you've been given Metronidazole. Take as directed, until finished, and do not drink alcohol while taking this medication. See your urologist for your interstitial cystitis. Return to the emergency department for any changing or worsening symptoms. Take Norco as directed for pain, do not drive while taking this medication. Stay well hydrated.   Bacterial Vaginosis Bacterial vaginosis is an infection of the vagina. It happens when too many of certain germs (bacteria) grow in the vagina. HOME CARE  Take your medicine as told by your doctor.  Finish your medicine even if you start to feel better.  Do not have sex until you finish your medicine and are better.  Tell your sex partner that you have an infection. They should see their doctor for treatment.  Practice safe sex. Use condoms. Have only one sex partner. GET HELP IF:  You are not getting better after 3 days of treatment.  You have more grey fluid (discharge) coming from your vagina than before.  You have more pain than before.  You have a fever. MAKE SURE YOU:   Understand these instructions.  Will watch your condition.  Will get help right away if you are not doing well or get worse. Document Released: 06/06/2008 Document Revised: 06/18/2013 Document Reviewed: 04/09/2013 [facility] Patient Information 2015 [place], [place]. This information is not intended to replace advice given to you by your health care provider. Make sure you discuss any questions you have with your health care provider.  Interstitial Cystitis Interstitial cystitis (IC) is a condition that results in discomfort or pain in the bladder and the surrounding pelvic region. The symptoms can be different from case to case and even in the same individual. People may experience:  Mild discomfort.  Pressure.  Tenderness.  Intense pain in the bladder and pelvic area. CAUSES  Because IC varies so  much in symptoms and severity, people studying this disease believe it is not one but several diseases. Some caregivers use the term painful bladder syndrome (PBS) to describe cases with painful urinary symptoms. This may not meet the strictest definition of IC. The term IC / PBS includes all cases of urinary pain that cannot be connected to other causes, such as infection or urinary stones.  SYMPTOMS  Symptoms may include:  An urgent need to urinate.  A frequent need to urinate.  A combination of these symptoms. Pain may change in intensity as the bladder fills with urine or as it empties. Women's symptoms often get worse during menstruation. They may sometimes experience pain with vaginal intercourse. Some of the symptoms of IC / PBS seem like those of bacterial infection. Tests do not show infection. IC / PBS is far more common in women than in men.  DIAGNOSIS  The diagnosis of IC / PBS is based on:  Presence of pain related to the bladder, usually along with problems of frequency and urgency.  Not finding other diseases that could cause the symptoms.  Diagnostic tests that help rule out other diseases include:  Urinalysis.  Urine culture.  Cystoscopy.  Biopsy of the bladder wall.  Distension of the bladder under anesthesia.  Urine cytology.  Laboratory examination of prostate secretions. A biopsy is a tissue sample that can be looked at under a microscope. Samples of the bladder and urethra may be removed during a cystoscopy. A biopsy helps rule out bladder cancer. TREATMENT  Scientists have not yet found a cure for IC / PBS. Patients  with IC / PBS do not get better with antibiotic therapy. Caregivers cannot predict who will respond best to which treatment. Symptoms may disappear without explanation. Disappearing symptoms may coincide with an event such as a change in diet or treatment. Even when symptoms disappear, they may return after days, weeks, months, or years.  Because  the causes of IC / PBS are unknown, current treatments are aimed at relieving symptoms. Many people are helped by one or a combination of the treatments. As researchers learn more about IC / PBS, the list of potential treatments will change. Patients should discuss their options with a caregiver. SURGERY  Surgery should be considered only if all available treatments have failed and the pain is disabling. Many approaches and techniques are used. Each approach has its own advantages and complications. Advantages and complications should be discussed with a urologist. Your caregiver may recommend consulting another urologist for a second opinion. Most caregivers are reluctant to operate because the outcome is unpredictable. Some people still have symptoms after surgery.  People considering surgery should discuss the potential risks and benefits, side effects, and long- and short-term complications with their family, as well as with people who have already had the procedure. Surgery requires anesthesia, hospitalization, and in some cases weeks or months of recovery. As the complexity of the procedure increases, so do the chances for complications and for failure. HOME CARE INSTRUCTIONS   All drugs, even those sold over the counter, have side effects. Patients should always consult a caregiver before using any drug for an extended amount of time. Only take over-the-counter or prescription medicines for pain, discomfort, or fever as directed by your caregiver.  Many patients feel that smoking makes their symptoms worse. How the by-products of tobacco that are excreted in the urine affect IC / PBS is unknown. Smoking is the major known cause of bladder cancer. One of the best things smokers can do for their bladder and their overall health is to quit.  Many patients feel that gentle stretching exercises help relieve IC / PBS symptoms.  Methods vary, but basically patients decide to empty their bladder at  designated times and use relaxation techniques and distractions to keep to the schedule. Gradually, patients try to lengthen the time between scheduled voids. A diary in which to record voiding times is usually helpful in keeping track of progress. MAKE SURE YOU:   Understand these instructions.  Will watch your condition.  Will get help right away if you are not doing well or get worse. Document Released: 04/28/2004 Document Revised: 11/20/2011 Document Reviewed: 07/13/2008 [facility] Patient Information 2015 [place], [place]. This information is not intended to replace advice given to you by your health care provider. Make sure you discuss any questions you have with your health care provider.

## 2014-04-06 NOTE — ED Notes (Addendum)
Mrs Gillentine given morphine IM and c/o itching order for benadryl 25 mg IM obtained and given. Pelvic cart set up and at bedside

## 2014-04-06 NOTE — ED Provider Notes (Signed)
Medical screening examination/treatment/procedure(s) were performed by non-physician practitioner and as supervising physician I was immediately available for consultation/collaboration.   EKG Interpretation None        Osvaldo Shipper, MD 04/06/14 2315

## 2014-04-07 NOTE — Progress Notes (Signed)
This CM received a phone call from [place] [facility] stating that the patient was in to fill a prescription from 04/06/14 for hydrocodone and she wanted to know if the EDP was aware that the patient just filled a prescription for oxycodone and OxyContin on 7/6. This CM informed [employer] that the EDP notes did not reflect knowledge of the previous narcotic prescriptions. This Cm also informed [employer] that the patient is usually seen at [facility] and has never been seen in the [facility] ED. [employer] Matter requested the contact information for [facility] to follow up with their staff so the contact information was provided. No other questions or concerns. Edwyna Shell, RN, BSN, Case Manager 04/07/2014 1:03 PM

## 2014-04-16 ENCOUNTER — Emergency Department (HOSPITAL_BASED_OUTPATIENT_CLINIC_OR_DEPARTMENT_OTHER)
Admission: EM | Admit: 2014-04-16 | Discharge: 2014-04-16 | Disposition: A | Discharge status: Home / Self Care | Payer: Medicare Other | Attending: Emergency Medicine | Admitting: Emergency Medicine

## 2014-04-16 ENCOUNTER — Encounter (HOSPITAL_BASED_OUTPATIENT_CLINIC_OR_DEPARTMENT_OTHER): Payer: Self-pay | Admitting: Emergency Medicine

## 2014-04-16 ENCOUNTER — Emergency Department (HOSPITAL_BASED_OUTPATIENT_CLINIC_OR_DEPARTMENT_OTHER): Payer: Medicare Other

## 2014-04-16 DIAGNOSIS — S4991XA Unspecified injury of right shoulder and upper arm, initial encounter: Secondary | ICD-10-CM

## 2014-04-16 DIAGNOSIS — W19XXXA Unspecified fall, initial encounter: Secondary | ICD-10-CM

## 2014-04-16 DIAGNOSIS — Z79899 Other long term (current) drug therapy: Secondary | ICD-10-CM | POA: Diagnosis not present

## 2014-04-16 DIAGNOSIS — F411 Generalized anxiety disorder: Secondary | ICD-10-CM | POA: Insufficient documentation

## 2014-04-16 DIAGNOSIS — F17200 Nicotine dependence, unspecified, uncomplicated: Secondary | ICD-10-CM | POA: Insufficient documentation

## 2014-04-16 DIAGNOSIS — E119 Type 2 diabetes mellitus without complications: Secondary | ICD-10-CM | POA: Diagnosis not present

## 2014-04-16 DIAGNOSIS — W06XXXA Fall from bed, initial encounter: Secondary | ICD-10-CM | POA: Diagnosis not present

## 2014-04-16 DIAGNOSIS — Z8742 Personal history of other diseases of the female genital tract: Secondary | ICD-10-CM | POA: Insufficient documentation

## 2014-04-16 DIAGNOSIS — G8929 Other chronic pain: Secondary | ICD-10-CM | POA: Insufficient documentation

## 2014-04-16 DIAGNOSIS — G608 Other hereditary and idiopathic neuropathies: Secondary | ICD-10-CM | POA: Insufficient documentation

## 2014-04-16 DIAGNOSIS — S46909A Unspecified injury of unspecified muscle, fascia and tendon at shoulder and upper arm level, unspecified arm, initial encounter: Secondary | ICD-10-CM | POA: Insufficient documentation

## 2014-04-16 DIAGNOSIS — Z859 Personal history of malignant neoplasm, unspecified: Secondary | ICD-10-CM | POA: Insufficient documentation

## 2014-04-16 DIAGNOSIS — Z87448 Personal history of other diseases of urinary system: Secondary | ICD-10-CM | POA: Diagnosis not present

## 2014-04-16 DIAGNOSIS — S4980XA Other specified injuries of shoulder and upper arm, unspecified arm, initial encounter: Secondary | ICD-10-CM | POA: Insufficient documentation

## 2014-04-16 DIAGNOSIS — Y9339 Activity, other involving climbing, rappelling and jumping off: Secondary | ICD-10-CM | POA: Insufficient documentation

## 2014-04-16 DIAGNOSIS — Z8739 Personal history of other diseases of the musculoskeletal system and connective tissue: Secondary | ICD-10-CM | POA: Diagnosis not present

## 2014-04-16 DIAGNOSIS — Y929 Unspecified place or not applicable: Secondary | ICD-10-CM | POA: Diagnosis not present

## 2014-04-16 NOTE — ED Provider Notes (Signed)
CSN: [id number]     Arrival date & time 04/16/14  1601 History   First MD Initiated Contact with Patient 04/16/14 1614     Chief Complaint  Patient presents with  . Shoulder Injury     (Consider location/radiation/quality/duration/timing/severity/associated sxs/prior Treatment) Patient is a 53 y.o. female presenting with shoulder injury. The history is provided by the patient.  Shoulder Injury This is a new problem. The current episode started yesterday. Episode frequency: once. The problem has not changed since onset.Pertinent negatives include no chest pain, no abdominal pain, no headaches and no shortness of breath. Exacerbated by: movement. The symptoms are relieved by medications. Treatments tried: oxycodone. The treatment provided mild relief.    Past Medical History  Diagnosis Date  . Anxiety   . Renal disorder   . Diabetes mellitus   . Cancer   . Fibromyalgia   . Neuropathy   . Hiatal hernia   . Osteoporosis   . Back pain   . Chronic pain   . Interstitial cystitis    Past Surgical History  Procedure Laterality Date  . Tonsillectomy    . Abdominal hysterectomy    . Cholecystectomy    . Tubal ligation    . Tracheostomy    . Cesarean section    . Knee surgery    . Appendectomy     Family History  Problem Relation Age of Onset  . Adopted: Yes   History  Substance Use Topics  . Smoking status: Current Every Day Smoker -- 0.25 packs/day    Types: Cigarettes  . Smokeless tobacco: Not on file  . Alcohol Use: No   OB History   Grav Para Term Preterm Abortions TAB SAB Ect Mult Living                 Review of Systems  Constitutional: Negative for fever and fatigue.  HENT: Negative for congestion and drooling.   Eyes: Negative for pain.  Respiratory: Negative for cough and shortness of breath.   Cardiovascular: Negative for chest pain.  Gastrointestinal: Negative for nausea, vomiting, abdominal pain and diarrhea.  Genitourinary: Negative for dysuria and  hematuria.  Musculoskeletal: Negative for back pain, gait problem and neck pain.  Skin: Negative for color change.  Neurological: Negative for dizziness and headaches.  Hematological: Negative for adenopathy.  Psychiatric/Behavioral: Negative for behavioral problems.  All other systems reviewed and are negative.     Allergies  Aspirin; Ibuprofen; Metformin and related; Reglan; Seroquel; Trazodone and nefazodone; Unasyn; and Zofran  Home Medications   Prior to Admission medications   Medication Sig Start Date End Date Taking? Authorizing Provider  acetaminophen (TYLENOL) 325 MG tablet Take 650 mg by mouth every 6 (six) hours as needed.    Historical Provider, MD  albuterol (PROVENTIL HFA;VENTOLIN HFA) 108 (90 BASE) MCG/ACT inhaler Inhale 2 puffs into the lungs every 6 (six) hours as needed for wheezing.    Historical Provider, MD  benzonatate (TESSALON) 100 MG capsule Take 1 capsule (100 mg total) by mouth every 8 (eight) hours. 07/22/13   Md Dorie Rank, MD  furosemide (LASIX) 40 MG tablet Take 40 mg by mouth as needed. Fluid 05/19/13   Historical Provider, MD  gabapentin (NEURONTIN) 800 MG tablet Take 800 mg by mouth 3 (three) times daily.    Historical Provider, MD  HYDROcodone-acetaminophen (NORCO) 5-325 MG per tablet Take 1-2 tablets by mouth every 6 (six) hours as needed for severe pain. 04/06/14   Mercedes Strupp Camprubi-Soms, PA-C  LORazepam (ATIVAN)  0.5 MG tablet Take 1 tablet (0.5 mg total) by mouth as needed for anxiety. 07/25/13   Waldon Merl, MD  metroNIDAZOLE (FLAGYL) 500 MG tablet Take 1 tablet (500 mg total) by mouth 2 (two) times daily. One po bid x 7 days 04/06/14   Patty Sermons Camprubi-Soms, PA-C  oxycodone (OXY-IR) 5 MG capsule Take 5 mg by mouth every 4 (four) hours as needed.    Historical Provider, MD  promethazine (PHENERGAN) 25 MG tablet Take 1 tablet (25 mg total) by mouth every 6 (six) hours as needed for nausea. 12/16/12   Mylinda Latina, MD  promethazine  (PHENERGAN) 25 MG tablet Take 1 tablet (25 mg total) by mouth every 6 (six) hours as needed for nausea or vomiting. 11/20/13   Threasa Beards, MD  TiZANidine HCl (ZANAFLEX PO) Take by mouth.    Historical Provider, MD  venlafaxine XR (EFFEXOR-XR) 37.5 MG 24 hr capsule Take 1 capsule (37.5 mg total) by mouth daily with breakfast. Take one capsule daily. 07/24/13   Waldon Merl, MD   BP 164/99  Pulse 108  Temp(Src) 98.2 F (36.8 C) (Oral)  Resp 16  Wt 175 lb (79.379 kg)  SpO2 100% Physical Exam  Nursing note and vitals reviewed. Constitutional: She is oriented to person, place, and time. She appears well-developed and well-nourished.  She appears mildly drowsy on exam.   HENT:  Head: Normocephalic and atraumatic.  Mouth/Throat: Oropharynx is clear and moist. No oropharyngeal exudate.  Eyes: Conjunctivae and EOM are normal. Pupils are equal, round, and reactive to light.  Neck: Normal range of motion. Neck supple.  Cardiovascular: Normal rate, regular rhythm, normal heart sounds and intact distal pulses.  Exam reveals no gallop and no friction rub.   No murmur heard. Pulmonary/Chest: Effort normal and breath sounds normal. No respiratory distress. She has no wheezes.  Abdominal: Soft. Bowel sounds are normal. There is no tenderness. There is no rebound and no guarding.  Musculoskeletal: She exhibits tenderness. She exhibits no edema.  Mild ecchymosis to the right anterior shoulder. Mild to moderate limitation of range of motion of the right shoulder due to pain. Normal range of motion and strength in the other joints of the right upper extremity. Normal sensation in the right upper extremity. 2+ distal and proximal pulses in the right upper extremity.  Neurological: She is alert and oriented to person, place, and time.  Skin: Skin is warm and dry.  Psychiatric: She has a normal mood and affect. Her behavior is normal.    ED Course  Procedures (including critical care time) Labs  Review Labs Reviewed - No data to display  Imaging Review Dg Shoulder Right  04/16/2014   CLINICAL DATA:  [place] [address] ladder chair with right shoulder pain and limited range of motion  EXAM: RIGHT SHOULDER - 2+ VIEW  COMPARISON:  None.  FINDINGS: No acute fracture is seen. The right humeral head is in normal position and the glenohumeral joint space appears normal. The right AC joint is normally aligned.  IMPRESSION: Negative.   Electronically Signed   By: Ivar Drape M.D.   On: 04/16/2014 17:07     EKG Interpretation None      MDM   Final diagnoses:  Fall, initial encounter  Right shoulder injury, initial encounter    5:05 PM 53 y.o. female who presents with right shoulder pain. She states that she had a nightmare last night and jumped bed landing on her right shoulder. She denies hitting her head  or loss of consciousness. She denies any headaches. She states that she fell asleep in a rocking chair today and slumped out of a rocking chair hitting her right shoulder again today. She denies any headache. She has focal pain in the right shoulder. Vital signs are unremarkable here. She does take an increased dose of narcotic agents and she appears mildly drowsy on exam. This has been noted during previous visits as well.  6:04 PM: Parents given referral to [facility] d/t likely overmedication w/ narcotics for chronic pain. Imaging non-contrib. Will give the patient a sling for comfort. I recommended only using it for a couple days and performing range of motion exercises daily to prevent frozen shoulder. I have discussed the diagnosis/risks/treatment options with the patient and family and believe the pt to be eligible for discharge home to follow-up with her pcp/[facility] as needed. We also discussed returning to the ED immediately if new or worsening sx occur. We discussed the sx which are most concerning (e.g., worsening pain) that necessitate immediate return. Medications administered to the patient during  their visit and any new prescriptions provided to the patient are listed below.  Medications given during this visit Medications - No data to display  New Prescriptions   No medications on file       Pamella Pert, MD 04/16/14 1805

## 2014-04-16 NOTE — ED Notes (Signed)
Pt complained that I had the wrong size sling for her I informed the pt that it was the appropriate size and that she did not want to use the arm also if I put a smaller one on it would compromise circulation to her had. Pt still insisted it was not right so I had Dr. Pamella Pert to review the sling. Per MD the sling was appropriate.

## 2014-04-16 NOTE — ED Notes (Addendum)
Pt c/o fall from chair right shoulder injury x 1 hr ago pt unable to holds eyes open appears very sleep in triage.

## 2014-04-16 NOTE — ED Notes (Signed)
Pt c/o that sling does not fit her correctly. EDP in to see pt and assured pt that the sling is for her comfort and it is correctly applied and the correct size.

## 2014-04-28 ENCOUNTER — Emergency Department (HOSPITAL_BASED_OUTPATIENT_CLINIC_OR_DEPARTMENT_OTHER)
Admission: EM | Admit: 2014-04-28 | Discharge: 2014-04-28 | Disposition: A | Discharge status: Home / Self Care | Payer: Medicare Other | Attending: Emergency Medicine | Admitting: Emergency Medicine

## 2014-04-28 ENCOUNTER — Encounter (HOSPITAL_BASED_OUTPATIENT_CLINIC_OR_DEPARTMENT_OTHER): Payer: Self-pay | Admitting: Emergency Medicine

## 2014-04-28 DIAGNOSIS — N390 Urinary tract infection, site not specified: Secondary | ICD-10-CM | POA: Diagnosis not present

## 2014-04-28 DIAGNOSIS — IMO0002 Reserved for concepts with insufficient information to code with codable children: Secondary | ICD-10-CM | POA: Insufficient documentation

## 2014-04-28 DIAGNOSIS — Z8669 Personal history of other diseases of the nervous system and sense organs: Secondary | ICD-10-CM | POA: Diagnosis not present

## 2014-04-28 DIAGNOSIS — Z9851 Tubal ligation status: Secondary | ICD-10-CM | POA: Insufficient documentation

## 2014-04-28 DIAGNOSIS — G8929 Other chronic pain: Secondary | ICD-10-CM | POA: Insufficient documentation

## 2014-04-28 DIAGNOSIS — F411 Generalized anxiety disorder: Secondary | ICD-10-CM | POA: Insufficient documentation

## 2014-04-28 DIAGNOSIS — R300 Dysuria: Secondary | ICD-10-CM | POA: Diagnosis present

## 2014-04-28 DIAGNOSIS — Z8739 Personal history of other diseases of the musculoskeletal system and connective tissue: Secondary | ICD-10-CM | POA: Diagnosis not present

## 2014-04-28 DIAGNOSIS — F17200 Nicotine dependence, unspecified, uncomplicated: Secondary | ICD-10-CM | POA: Insufficient documentation

## 2014-04-28 DIAGNOSIS — Z90710 Acquired absence of both cervix and uterus: Secondary | ICD-10-CM | POA: Insufficient documentation

## 2014-04-28 DIAGNOSIS — R000 Tachycardia, unspecified: Secondary | ICD-10-CM | POA: Insufficient documentation

## 2014-04-28 DIAGNOSIS — Z792 Long term (current) use of antibiotics: Secondary | ICD-10-CM | POA: Diagnosis not present

## 2014-04-28 DIAGNOSIS — Z9089 Acquired absence of other organs: Secondary | ICD-10-CM | POA: Diagnosis not present

## 2014-04-28 DIAGNOSIS — R319 Hematuria, unspecified: Secondary | ICD-10-CM | POA: Insufficient documentation

## 2014-04-28 DIAGNOSIS — E119 Type 2 diabetes mellitus without complications: Secondary | ICD-10-CM | POA: Insufficient documentation

## 2014-04-28 DIAGNOSIS — Z79899 Other long term (current) drug therapy: Secondary | ICD-10-CM | POA: Insufficient documentation

## 2014-04-28 LAB — URINALYSIS, ROUTINE W REFLEX MICROSCOPIC
BILIRUBIN URINE: NEGATIVE
Glucose, UA: NEGATIVE mg/dL
KETONES UR: 15 mg/dL — AB
NITRITE: NEGATIVE
PH: 6 (ref 5.0–8.0)
PROTEIN: NEGATIVE mg/dL
Specific Gravity, Urine: 1.027 (ref 1.005–1.030)
UROBILINOGEN UA: 0.2 mg/dL (ref 0.0–1.0)

## 2014-04-28 LAB — URINE MICROSCOPIC-ADD ON

## 2014-04-28 MED ORDER — PHENAZOPYRIDINE HCL 200 MG PO TABS
200.0000 mg | ORAL_TABLET | Freq: Three times a day (TID) | ORAL | Status: AC
Start: 2014-04-28 — End: ?

## 2014-04-28 MED ORDER — CIPROFLOXACIN HCL 500 MG PO TABS: 500.0000 mg | ORAL_TABLET | Freq: Two times a day (BID) | ORAL | Status: AC

## 2014-04-28 NOTE — ED Provider Notes (Signed)
CSN: [id number]     Arrival date & time 04/28/14  1404 History   First MD Initiated Contact with Patient 04/28/14 1500     No chief complaint on file.    (Consider location/radiation/quality/duration/timing/severity/associated sxs/prior Treatment) HPI Comments: Patient is a 53 year old female with a past medical history of anxiety, interstitial cystitis, chronic pain, osteoporosis, fibromyalgia and diabetes who presents to the emergency department complaining of dysuria increasing over the past 3-4 days. Patient reports she has a history of interstitial cystitis and suffers from bladder pain. She has an appointment with her urologist in 2 days. She reports increased urinary frequency and urgency, however has not urinated since 7:00 this morning. She reports she has a lot of pressure in her bladder radiating around towards her back. States she feels hot but is not sure she has a fever. Admits to associated nausea and vomiting. States she had a few episodes of nonbloody emesis yesterday and one episode today while she was in the emergency department in the trash can. States she feels like a "telephone pole is rubbing up and down her vagina" the burning is so severe.  The history is provided by the patient.    Past Medical History  Diagnosis Date  . Anxiety   . Renal disorder   . Diabetes mellitus   . Cancer   . Fibromyalgia   . Neuropathy   . Hiatal hernia   . Osteoporosis   . Back pain   . Chronic pain   . Interstitial cystitis    Past Surgical History  Procedure Laterality Date  . Tonsillectomy    . Abdominal hysterectomy    . Cholecystectomy    . Tubal ligation    . Tracheostomy    . Cesarean section    . Knee surgery    . Appendectomy     Family History  Problem Relation Age of Onset  . Adopted: Yes   History  Substance Use Topics  . Smoking status: Current Every Day Smoker -- 0.25 packs/day    Types: Cigarettes  . Smokeless tobacco: Not on file  . Alcohol Use: No    OB History   Grav Para Term Preterm Abortions TAB SAB Ect Mult Living                 Review of Systems  Gastrointestinal: Positive for nausea and vomiting.  Genitourinary: Positive for dysuria, urgency and frequency.  All other systems reviewed and are negative.     Allergies  Aspirin; Ibuprofen; Metformin and related; Reglan; Seroquel; Trazodone and nefazodone; Unasyn; and Zofran  Home Medications   Prior to Admission medications   Medication Sig Start Date End Date Taking? Authorizing Provider  acetaminophen (TYLENOL) 325 MG tablet Take 650 mg by mouth every 6 (six) hours as needed.    Historical Provider, MD  albuterol (PROVENTIL HFA;VENTOLIN HFA) 108 (90 BASE) MCG/ACT inhaler Inhale 2 puffs into the lungs every 6 (six) hours as needed for wheezing.    Historical Provider, MD  benzonatate (TESSALON) 100 MG capsule Take 1 capsule (100 mg total) by mouth every 8 (eight) hours. 07/22/13   Dorie Rank, MD  ciprofloxacin (CIPRO) 500 MG tablet Take 1 tablet (500 mg total) by mouth 2 (two) times daily. One po bid x 7 days 04/28/14   Illene Labrador, PA-C  furosemide (LASIX) 40 MG tablet Take 40 mg by mouth as needed. Fluid 05/19/13   Historical Provider, MD  gabapentin (NEURONTIN) 800 MG tablet Take 800 mg by mouth  3 (three) times daily.    Historical Provider, MD  HYDROcodone-acetaminophen (NORCO) 5-325 MG per tablet Take 1-2 tablets by mouth every 6 (six) hours as needed for severe pain. 04/06/14   Mercedes Strupp Camprubi-Soms, PA-C  LORazepam (ATIVAN) 0.5 MG tablet Take 1 tablet (0.5 mg total) by mouth as needed for anxiety. 07/25/13   Waldon Merl, MD  metroNIDAZOLE (FLAGYL) 500 MG tablet Take 1 tablet (500 mg total) by mouth 2 (two) times daily. One po bid x 7 days 04/06/14   Patty Sermons Camprubi-Soms, PA-C  oxycodone (OXY-IR) 5 MG capsule Take 5 mg by mouth every 4 (four) hours as needed.    Historical Provider, MD  phenazopyridine (PYRIDIUM) 200 MG tablet Take 1 tablet (200 mg  total) by mouth 3 (three) times daily. 04/28/14   Illene Labrador, PA-C  promethazine (PHENERGAN) 25 MG tablet Take 1 tablet (25 mg total) by mouth every 6 (six) hours as needed for nausea. 12/16/12   Mylinda Latina, MD  promethazine (PHENERGAN) 25 MG tablet Take 1 tablet (25 mg total) by mouth every 6 (six) hours as needed for nausea or vomiting. 11/20/13   Threasa Beards, MD  TiZANidine HCl (ZANAFLEX PO) Take by mouth.    Historical Provider, MD  venlafaxine XR (EFFEXOR-XR) 37.5 MG 24 hr capsule Take 1 capsule (37.5 mg total) by mouth daily with breakfast. Take one capsule daily. 07/24/13   Waldon Merl, MD   BP 137/100  Pulse 106  Temp(Src) 97.9 F (36.6 C) (Oral)  Resp 16  Ht 5' (1.524 m)  Wt 175 lb (79.379 kg)  BMI 34.18 kg/m2  SpO2 96% Physical Exam  Nursing note and vitals reviewed. Constitutional: She is oriented to person, place, and time. She appears well-developed and well-nourished. No distress.  HENT:  Head: Normocephalic and atraumatic.  Mouth/Throat: Oropharynx is clear and moist.  Moist MM.  Eyes: Conjunctivae are normal.  Neck: Normal range of motion. Neck supple.  Cardiovascular: Regular rhythm and normal heart sounds.   Tachy ~100.  Pulmonary/Chest: Effort normal and breath sounds normal.  Abdominal: Soft. Normal appearance and bowel sounds are normal. She exhibits no distension. There is tenderness in the suprapubic area. There is no rigidity, no rebound and no guarding.  Very mild bilateral CVA tenderness. No peritoneal signs.  Musculoskeletal: Normal range of motion. She exhibits no edema.  Neurological: She is alert and oriented to person, place, and time.  Skin: Skin is warm and dry. She is not diaphoretic.  Psychiatric:  Agitated, hyperactive.    ED Course  Procedures (including critical care time) Labs Review Labs Reviewed  URINALYSIS, ROUTINE W REFLEX MICROSCOPIC - Abnormal; Notable for the following:    APPearance CLOUDY (*)    Hgb urine dipstick  TRACE (*)    Ketones, ur 15 (*)    Leukocytes, UA LARGE (*)    All other components within normal limits  URINE MICROSCOPIC-ADD ON - Abnormal; Notable for the following:    Squamous Epithelial / LPF FEW (*)    Bacteria, UA MANY (*)    All other components within normal limits  URINE CULTURE    Imaging Review No results found.   EKG Interpretation None      MDM   Final diagnoses:  Urinary tract infection with hematuria, site unspecified   Patient presenting with dysuria, increased urinary frequency and urgency. She is nontoxic appearing and in no apparent distress. She is very anxious, agitated and hyperactive. Slightly tachycardic. Abdomen with suprapubic tenderness,  very mild CVA tenderness. Urinalysis positive for urinary tract infection. States she had an episode of vomiting in the trash can in the exam room, however there is no evidence of vomiting in the trash can. Oral challenge given, patient reports she again vomited in the trash can, and again the trash can does not have anything in it. I discussed these findings with patient and told her I would put her on an antibiotic and give her Pyridium. She then states "no, you will give me an IV, IV fluids and Zofran with Benadryl". I do not feel patient needs IV fluids at this time. Patient became very agitated and stated "just give me my papers and prescriptions, I am leaving". Patient left the emergency department.  Addendum- on chart review, there has been concern for drug abuse in the past. Patient very adamant about receiving Zofran with Benadryl and continuously requesting pain medication. She had 30 day supply of both oxycontin and oxycodone filled on 04/13/14. Illene Labrador, PA-C 04/28/14 [place], PA-C 04/28/14 [id number]

## 2014-04-28 NOTE — ED Notes (Signed)
Pt reports that she has a burning sensation when she urinates. Pt says she has not urinated since 0700 today and is having increased pressure in her bladder. Pt says that she has a hx of interstitial cystitis and is due to see her urologist on Thursday.

## 2014-04-28 NOTE — Discharge Instructions (Signed)
Take antibiotic to completion. Take pyridium as directed. Followup with your urologist.  Urinary Tract Infection Urinary tract infections (UTIs) can develop anywhere along your urinary tract. Your urinary tract is your body's drainage system for removing wastes and extra water. Your urinary tract includes two kidneys, two ureters, a bladder, and a urethra. Your kidneys are a pair of bean-shaped organs. Each kidney is about the size of your fist. They are located below your ribs, one on each side of your spine. CAUSES Infections are caused by microbes, which are microscopic organisms, including fungi, viruses, and bacteria. These organisms are so small that they can only be seen through a microscope. Bacteria are the microbes that most commonly cause UTIs. SYMPTOMS  Symptoms of UTIs may vary by age and gender of the patient and by the location of the infection. Symptoms in young women typically include a frequent and intense urge to urinate and a painful, burning feeling in the bladder or urethra during urination. Older women and men are more likely to be tired, shaky, and weak and have muscle aches and abdominal pain. A fever may mean the infection is in your kidneys. Other symptoms of a kidney infection include pain in your back or sides below the ribs, nausea, and vomiting. DIAGNOSIS To diagnose a UTI, your caregiver will ask you about your symptoms. Your caregiver also will ask to provide a urine sample. The urine sample will be tested for bacteria and white blood cells. White blood cells are made by your body to help fight infection. TREATMENT  Typically, UTIs can be treated with medication. Because most UTIs are caused by a bacterial infection, they usually can be treated with the use of antibiotics. The choice of antibiotic and length of treatment depend on your symptoms and the type of bacteria causing your infection. HOME CARE INSTRUCTIONS  If you were prescribed antibiotics, take them exactly  as your caregiver instructs you. Finish the medication even if you feel better after you have only taken some of the medication.  Drink enough water and fluids to keep your urine clear or pale yellow.  Avoid caffeine, tea, and carbonated beverages. They tend to irritate your bladder.  Empty your bladder often. Avoid holding urine for long periods of time.  Empty your bladder before and after sexual intercourse.  After a bowel movement, women should cleanse from front to back. Use each tissue only once. SEEK MEDICAL CARE IF:   You have back pain.  You develop a fever.  Your symptoms do not begin to resolve within 3 days. SEEK IMMEDIATE MEDICAL CARE IF:   You have severe back pain or lower abdominal pain.  You develop chills.  You have nausea or vomiting.  You have continued burning or discomfort with urination. MAKE SURE YOU:   Understand these instructions.  Will watch your condition.  Will get help right away if you are not doing well or get worse. Document Released: 06/07/2005 Document Revised: 02/27/2012 Document Reviewed: 10/06/2011 [facility] Patient Information 2015 [place], [place]. This information is not intended to replace advice given to you by your health care provider. Make sure you discuss any questions you have with your health care provider.

## 2014-04-29 NOTE — ED Provider Notes (Signed)
Medical screening examination/treatment/procedure(s) were performed by non-physician practitioner and as supervising physician I was immediately available for consultation/collaboration.   EKG Interpretation None        Pamella Pert, MD 04/29/14 1105

## 2014-05-01 LAB — URINE CULTURE: Colony Count: 55000

## 2014-05-02 ENCOUNTER — Telehealth (HOSPITAL_BASED_OUTPATIENT_CLINIC_OR_DEPARTMENT_OTHER): Payer: Self-pay

## 2014-05-02 NOTE — Telephone Encounter (Signed)
Post ED Visit - Positive Culture Follow-up  Culture report reviewed by antimicrobial stewardship pharmacist: []  Wes Crystal, Pharm.D., BCPS []  Heide Guile, Pharm.D., BCPS []  Alycia Rossetti, Pharm.D., BCPS []  [place], Pharm.D., BCPS, AAHIVP [x]  Legrand Como, Pharm.D., BCPS, AAHIVP []  Hassie Bruce, Pharm.D. []  Milus Glazier, Pharm.D.  Positive Urine culture, 55,000 colonies -> E Coli Treated with Ciprofloxacin, organism sensitive to the same and no further patient follow-up is required at this time.  Dortha Kern 05/02/2014, 11:06 PM

## 2015-01-05 ENCOUNTER — Emergency Department (HOSPITAL_BASED_OUTPATIENT_CLINIC_OR_DEPARTMENT_OTHER): Payer: Medicare Other

## 2015-01-05 ENCOUNTER — Emergency Department (HOSPITAL_BASED_OUTPATIENT_CLINIC_OR_DEPARTMENT_OTHER)
Admission: EM | Admit: 2015-01-05 | Discharge: 2015-01-05 | Disposition: A | Discharge status: Home / Self Care | Payer: Medicare Other | Attending: Emergency Medicine | Admitting: Emergency Medicine

## 2015-01-05 ENCOUNTER — Encounter (HOSPITAL_BASED_OUTPATIENT_CLINIC_OR_DEPARTMENT_OTHER): Payer: Self-pay | Admitting: *Deleted

## 2015-01-05 DIAGNOSIS — F419 Anxiety disorder, unspecified: Secondary | ICD-10-CM | POA: Insufficient documentation

## 2015-01-05 DIAGNOSIS — Z79899 Other long term (current) drug therapy: Secondary | ICD-10-CM | POA: Diagnosis not present

## 2015-01-05 DIAGNOSIS — E119 Type 2 diabetes mellitus without complications: Secondary | ICD-10-CM | POA: Insufficient documentation

## 2015-01-05 DIAGNOSIS — Z720 Tobacco use: Secondary | ICD-10-CM | POA: Insufficient documentation

## 2015-01-05 DIAGNOSIS — W010XXA Fall on same level from slipping, tripping and stumbling without subsequent striking against object, initial encounter: Secondary | ICD-10-CM | POA: Diagnosis not present

## 2015-01-05 DIAGNOSIS — G8929 Other chronic pain: Secondary | ICD-10-CM | POA: Insufficient documentation

## 2015-01-05 DIAGNOSIS — M5441 Lumbago with sciatica, right side: Secondary | ICD-10-CM | POA: Diagnosis not present

## 2015-01-05 DIAGNOSIS — Z8719 Personal history of other diseases of the digestive system: Secondary | ICD-10-CM | POA: Diagnosis not present

## 2015-01-05 DIAGNOSIS — Y939 Activity, unspecified: Secondary | ICD-10-CM | POA: Diagnosis not present

## 2015-01-05 DIAGNOSIS — Y929 Unspecified place or not applicable: Secondary | ICD-10-CM | POA: Diagnosis not present

## 2015-01-05 DIAGNOSIS — Z792 Long term (current) use of antibiotics: Secondary | ICD-10-CM | POA: Insufficient documentation

## 2015-01-05 DIAGNOSIS — S3992XA Unspecified injury of lower back, initial encounter: Secondary | ICD-10-CM | POA: Diagnosis present

## 2015-01-05 DIAGNOSIS — Z87448 Personal history of other diseases of urinary system: Secondary | ICD-10-CM | POA: Insufficient documentation

## 2015-01-05 DIAGNOSIS — Z859 Personal history of malignant neoplasm, unspecified: Secondary | ICD-10-CM | POA: Diagnosis not present

## 2015-01-05 DIAGNOSIS — Y999 Unspecified external cause status: Secondary | ICD-10-CM | POA: Insufficient documentation

## 2015-01-05 MED ORDER — HYDROMORPHONE HCL 1 MG/ML IJ SOLN
1.0000 mg | Freq: Once | INTRAMUSCULAR | Status: AC
  Administered 2015-01-05: 1 mg via INTRAMUSCULAR
  Filled 2015-01-05: qty 1

## 2015-01-05 NOTE — ED Notes (Signed)
Pt alert x4 respirations easy non labored. Friend driving home.

## 2015-01-05 NOTE — ED Provider Notes (Signed)
CSN: [id number]     Arrival date & time 01/05/15  1719 History   First MD Initiated Contact with Patient 01/05/15 1813     Chief Complaint  Patient presents with  . Back Pain    HPI   54 year old female presents with acute on chronic back pain. Patient reports that on Friday she slipped on wet floor falling back landing on her buttocks. She reports shooting sharp pain down her right leg stopping at her knee. She reports this pain is similar to her sciatica she experiences with her chronic back pain. Patient reports difficulty with ambulation requiring a cane due to the acute episode, but reports that she forgot it at home today and did not use it when ambulating and facility. Patient reports that she currently receives oxycodone for chronic back pain point she took 5 mg at noon today with little relief of symptoms. Patient also reports that she takes Zanaflex for her back pain. Patient reports today's episode is similar to previous episodes, but reports it's worse. Patient denies any pain to her upper back, distal extremities, or increased over her baseline neuropathy. She denies changes in bowel or bladder retention or incontinence, no fevers, no saddle anesthesia. She reports that she has a appointment Friday with her primary care provider who is currently managing her chronic back pain.  Past Medical History  Diagnosis Date  . Anxiety   . Renal disorder   . Diabetes mellitus   . Cancer   . Fibromyalgia   . Neuropathy   . Hiatal hernia   . Osteoporosis   . Back pain   . Chronic pain   . Interstitial cystitis    Past Surgical History  Procedure Laterality Date  . Tonsillectomy    . Abdominal hysterectomy    . Cholecystectomy    . Tubal ligation    . Tracheostomy    . Cesarean section    . Knee surgery    . Appendectomy     Family History  Problem Relation Age of Onset  . Adopted: Yes   History  Substance Use Topics  . Smoking status: Current Every Day Smoker -- 0.25  packs/day    Types: Cigarettes  . Smokeless tobacco: Not on file  . Alcohol Use: No   OB History    No data available     Review of Systems  All other systems reviewed and are negative.  Allergies  Aspirin; Ibuprofen; Metformin and related; Reglan; Seroquel; Trazodone and nefazodone; Unasyn; and Zofran  Home Medications   Prior to Admission medications   Medication Sig Start Date End Date Taking? Authorizing Provider  acetaminophen (TYLENOL) 325 MG tablet Take 650 mg by mouth every 6 (six) hours as needed.    Historical Provider, MD  albuterol (PROVENTIL HFA;VENTOLIN HFA) 108 (90 BASE) MCG/ACT inhaler Inhale 2 puffs into the lungs every 6 (six) hours as needed for wheezing.    Historical Provider, MD  benzonatate (TESSALON) 100 MG capsule Take 1 capsule (100 mg total) by mouth every 8 (eight) hours. 07/22/13   Dorie Rank, MD  ciprofloxacin (CIPRO) 500 MG tablet Take 1 tablet (500 mg total) by mouth 2 (two) times daily. One po bid x 7 days 04/28/14   Carman Ching, PA-C  furosemide (LASIX) 40 MG tablet Take 40 mg by mouth as needed. Fluid 05/19/13   Historical Provider, MD  gabapentin (NEURONTIN) 800 MG tablet Take 800 mg by mouth 3 (three) times daily.    Historical Provider, MD  HYDROcodone-acetaminophen (NORCO) 5-325 MG per tablet Take 1-2 tablets by mouth every 6 (six) hours as needed for severe pain. 04/06/14   Mercedes Camprubi-Soms, PA-C  LORazepam (ATIVAN) 0.5 MG tablet Take 1 tablet (0.5 mg total) by mouth as needed for anxiety. 07/25/13   Waldon Merl, MD  metroNIDAZOLE (FLAGYL) 500 MG tablet Take 1 tablet (500 mg total) by mouth 2 (two) times daily. One po bid x 7 days 04/06/14   Mercedes Camprubi-Soms, PA-C  oxycodone (OXY-IR) 5 MG capsule Take 5 mg by mouth every 4 (four) hours as needed.    Historical Provider, MD  phenazopyridine (PYRIDIUM) 200 MG tablet Take 1 tablet (200 mg total) by mouth 3 (three) times daily. 04/28/14   Carman Ching, PA-C  promethazine (PHENERGAN) 25 MG  tablet Take 1 tablet (25 mg total) by mouth every 6 (six) hours as needed for nausea. 12/16/12   Mylinda Latina, MD  promethazine (PHENERGAN) 25 MG tablet Take 1 tablet (25 mg total) by mouth every 6 (six) hours as needed for nausea or vomiting. 11/20/13   Alfonzo Beers, MD  TiZANidine HCl (ZANAFLEX PO) Take by mouth.    Historical Provider, MD  venlafaxine XR (EFFEXOR-XR) 37.5 MG 24 hr capsule Take 1 capsule (37.5 mg total) by mouth daily with breakfast. Take one capsule daily. 07/24/13   Waldon Merl, MD   BP 116/80 mmHg  Pulse 105  Temp(Src) 98.3 F (36.8 C) (Oral)  Resp 20  Ht 5' (1.524 m)  Wt 184 lb (83.462 kg)  BMI 35.94 kg/m2  SpO2 98%   Physical Exam  Constitutional: She is oriented to person, place, and time. She appears well-developed and well-nourished.  HENT:  Head: Normocephalic and atraumatic.  Eyes: Conjunctivae are normal. Pupils are equal, round, and reactive to light. Right eye exhibits no discharge. Left eye exhibits no discharge. No scleral icterus.  Neck: Normal range of motion. No JVD present. No tracheal deviation present.  Pulmonary/Chest: Effort normal. No stridor.  Musculoskeletal:  No C-spine T-spine or L-spine tenderness, tenderness to the right para lumbar tissues. No signs of trauma, distal sensation, strength, profusion intact. Baseline neuropathy unchanged, no saddle anesthesia.  Neurological: She is alert and oriented to person, place, and time. Coordination normal.  Psychiatric: She has a normal mood and affect. Her behavior is normal. Judgment and thought content normal.  Nursing note and vitals reviewed.   ED Course  Procedures (including critical care time) Labs Review Labs Reviewed - No data to display  Imaging Review No results found.   EKG Interpretation None     MDM   Final diagnoses:  Right-sided low back pain with right-sided sciatica    Labs:none  Imaging:DG lumbar spine no significant  findings  Consults:none  Therapeutics:Dilaudid  Assessment:acute on chronic lower back pain  Plan: patient's pain today likely represents acute exacerbation of her chronic ongoing lower back pain. No concerning findings today including loss of distal sensation, strength, function, changes in bowel or bladder functioning, fever. Patient is currently on outpatient pain medication for this but reports it has not improved her current pain. Patient was given 1 dose of Dilaudid here in the emergency room and instructed to follow-up with her primary care provider for which she has an appointment scheduled for Friday. She is instructed to rest, avoid aggravating activities, monitor for worsening signs or symptoms; strict return precautions given. Patient understood and agreed to this plan.      Okey Regal, PA-C 01/05/15 [place], MD  01/06/15 0021 

## 2015-01-05 NOTE — ED Notes (Signed)
MD at bedside. 

## 2015-01-05 NOTE — ED Notes (Signed)
Back pain down her right leg since Friday after a fall.

## 2015-01-05 NOTE — Discharge Instructions (Signed)
Back Exercises Back exercises help treat and prevent back injuries. The goal of back exercises is to increase the strength of your abdominal and back muscles and the flexibility of your back. These exercises should be started when you no longer have back pain. Back exercises include:  Pelvic Tilt. Lie on your back with your knees bent. Tilt your pelvis until the lower part of your back is against the floor. Hold this position 5 to 10 sec and repeat 5 to 10 times.  Knee to Chest. Pull first 1 knee up against your chest and hold for 20 to 30 seconds, repeat this with the other knee, and then both knees. This may be done with the other leg straight or bent, whichever feels better.  Sit-Ups or Curl-Ups. Bend your knees 90 degrees. Start with tilting your pelvis, and do a partial, slow sit-up, lifting your trunk only 30 to 45 degrees off the floor. Take at least 2 to 3 seconds for each sit-up. Do not do sit-ups with your knees out straight. If partial sit-ups are difficult, simply do the above but with only tightening your abdominal muscles and holding it as directed.  Hip-Lift. Lie on your back with your knees flexed 90 degrees. Push down with your feet and shoulders as you raise your hips a couple inches off the floor; hold for 10 seconds, repeat 5 to 10 times.  Back arches. Lie on your stomach, propping yourself up on bent elbows. Slowly press on your hands, causing an arch in your low back. Repeat 3 to 5 times. Any initial stiffness and discomfort should lessen with repetition over time.  Shoulder-Lifts. Lie face down with arms beside your body. Keep hips and torso pressed to floor as you slowly lift your head and shoulders off the floor. Do not overdo your exercises, especially in the beginning. Exercises may cause you some mild back discomfort which lasts for a few minutes; however, if the pain is more severe, or lasts for more than 15 minutes, do not continue exercises until you see your caregiver.  Improvement with exercise therapy for back problems is slow.  See your caregivers for assistance with developing a proper back exercise program. Document Released: 10/05/2004 Document Revised: 11/20/2011 Document Reviewed: 06/29/2011 [facility] Patient Information 2015 [place], [place]. This information is not intended to replace advice given to you by your health care provider. Make sure you discuss any questions you have with your health care provider.  Discontinue your home pain regimen, he is back exercises as stated above. Please follow-up with Dr. Nancy Fetter as previously scheduled.

## 2016-02-26 ENCOUNTER — Inpatient Hospital Stay
Admit: 2016-02-26 | Discharge: 2016-03-03 | Disposition: A | Discharge status: Home / Self Care | Payer: MEDICARE | Source: Ambulatory Visit | Attending: Psychiatry | Admitting: Psychiatry

## 2016-02-26 DIAGNOSIS — F29 Unspecified psychosis not due to a substance or known physiological condition: Principal | ICD-10-CM

## 2016-02-26 LAB — POC GLUCOSE FINGERSTICK: POC Glucose: 66 mg/dL (ref 65–105)

## 2016-02-26 MED ORDER — OXYCODONE HCL ER 30 MG PO T12A
30 MG | Freq: Two times a day (BID) | ORAL | Status: DC
Start: 2016-02-26 — End: 2016-02-26

## 2016-02-26 MED ORDER — TIZANIDINE HCL 4 MG PO TABS
4 MG | Freq: Three times a day (TID) | ORAL | Status: DC | PRN
Start: 2016-02-26 — End: 2016-03-03
  Administered 2016-02-27 – 2016-03-03 (×16): 8 mg via ORAL

## 2016-02-26 MED ORDER — PROMETHAZINE HCL 12.5 MG PO TABS
12.5 MG | Freq: Four times a day (QID) | ORAL | Status: DC | PRN
Start: 2016-02-26 — End: 2016-03-03
  Administered 2016-02-27 – 2016-03-03 (×11): 12.5 mg via ORAL

## 2016-02-26 MED ORDER — GABAPENTIN 400 MG PO CAPS
400 MG | Freq: Three times a day (TID) | ORAL | Status: DC
Start: 2016-02-26 — End: 2016-03-03
  Administered 2016-02-27 – 2016-03-03 (×17): 800 mg via ORAL

## 2016-02-26 MED ORDER — OXYCODONE HCL ER 10 MG PO T12A
10 MG | Freq: Two times a day (BID) | ORAL | Status: DC
Start: 2016-02-26 — End: 2016-02-26

## 2016-02-26 MED ORDER — NICOTINE 21 MG/24HR TD PT24
21 MG/24HR | Freq: Every day | TRANSDERMAL | Status: DC
Start: 2016-02-26 — End: 2016-02-26

## 2016-02-26 MED ORDER — VENLAFAXINE HCL 75 MG PO TABS
75 MG | Freq: Two times a day (BID) | ORAL | Status: DC
Start: 2016-02-26 — End: 2016-03-03
  Administered 2016-02-27 – 2016-03-03 (×12): 150 mg via ORAL

## 2016-02-26 MED ORDER — BENZTROPINE MESYLATE 1 MG/ML IJ SOLN
1 MG/ML | Freq: Every day | INTRAMUSCULAR | Status: DC | PRN
Start: 2016-02-26 — End: 2016-03-03

## 2016-02-26 MED ORDER — ACETAMINOPHEN 325 MG PO TABS
325 MG | ORAL | Status: DC | PRN
Start: 2016-02-26 — End: 2016-03-03
  Administered 2016-02-27 – 2016-03-03 (×16): 650 mg via ORAL

## 2016-02-26 MED ORDER — HYDROXYZINE HCL 25 MG PO TABS
25 MG | Freq: Three times a day (TID) | ORAL | Status: DC | PRN
Start: 2016-02-26 — End: 2016-03-03
  Administered 2016-02-27 – 2016-03-03 (×16): 25 mg via ORAL

## 2016-02-26 MED ORDER — ALUM & MAG HYDROXIDE-SIMETH 200-200-20 MG/5ML PO SUSP
200-200-20 MG/5ML | Freq: Three times a day (TID) | ORAL | Status: DC | PRN
Start: 2016-02-26 — End: 2016-03-03

## 2016-02-26 MED ORDER — MAGNESIUM HYDROXIDE 400 MG/5ML PO SUSP
400 MG/5ML | Freq: Every evening | ORAL | Status: DC | PRN
Start: 2016-02-26 — End: 2016-03-03

## 2016-02-26 NOTE — Plan of Care (Signed)
Problem: Anger Management/Homicidal Ideation  Goal: STG-Able to express anger through appropriate verbalization and healthy physical outlets  Outcome: Ongoing  Pt did not attend Community Meeting at 0900 d/t resting in room despite staff invitation to attend.

## 2016-02-26 NOTE — Behavioral Health Treatment Team (Signed)
Page no return call. Raiford Nobleenee J Draya Felker, RN

## 2016-02-26 NOTE — Progress Notes (Signed)
Patient participated in walking group at 1430.

## 2016-02-26 NOTE — Behavioral Health Treatment Team (Signed)
Patient given tobacco quitline number 18007848669 at this time, refusing to call at this time, states " I just dont want to quit now"- patient given information as to the dangers of long term tobacco use. Continue to reinforce the importance of tobacco cessation.

## 2016-02-26 NOTE — Plan of Care (Signed)
Problem: Depressive Behavior with or without Suicide precautions  Goal: LTG-Able to verbalize acceptance of life and situations over which he or she has no control  Outcome: Ongoing  Pt denies SI/HI at this time, denies a/v hallucinations, focused on medication, isolative to self and others.  Cooperative and medication compliant.  Q 15 min safety checks maintained and at irregular intervals.

## 2016-02-26 NOTE — Plan of Care (Signed)
Problem: Anger Management/Homicidal Ideation  Goal: STG-Able to express anger through appropriate verbalization and healthy physical outlets  Outcome: Ongoing  Pt denies having any suicidal or homicidal ideations.    Problem: Depressive Behavior with or without Suicide precautions  Goal: LTG-Able to verbalize acceptance of life and situations over which he or she has no control  Outcome: Ongoing  Pt is not offering much info into her treatment, pt is denying all at this time, pt is short with Clinical research associatewriter. Pt is also seclusive to her room and not social with her peers or staff, pt is not going to group.

## 2016-02-26 NOTE — Behavioral Health Treatment Team (Signed)
Dr. Chales AbrahamsGupta paged for admission orders at this time.

## 2016-02-26 NOTE — Behavioral Health Treatment Team (Signed)
Paged: paged Dr for orders. Raiford Nobleenee J Desarie Feild, RN

## 2016-02-26 NOTE — Plan of Care (Signed)
Problem: Anger Management/Homicidal Ideation  Goal: STG-Able to express anger through appropriate verbalization and healthy physical outlets  Outcome: Ongoing  Pt did not attend Recreational Therapy at 1100 d/t resting in room despite staff invitation to attend.

## 2016-02-26 NOTE — Plan of Care (Signed)
Problem: Falls - Risk of  Goal: Absence of falls  Outcome: Ongoing  Pt remains free from falls.  Non skid footwear with ambulation.  Gait slow and steady.

## 2016-02-26 NOTE — Behavioral Health Treatment Team (Signed)
Dr. Berton Laneturns call and states will review them and put orders in.
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
## 2016-02-27 MED ORDER — ARIPIPRAZOLE 10 MG PO TABS
10 MG | Freq: Every day | ORAL | Status: DC
Start: 2016-02-27 — End: 2016-03-03
  Administered 2016-02-27 – 2016-03-03 (×6): 10 mg via ORAL

## 2016-02-27 MED ORDER — NICOTINE POLACRILEX 2 MG MT GUM
2 MG | OROMUCOSAL | Status: DC | PRN
Start: 2016-02-27 — End: 2016-03-03
  Administered 2016-02-27 – 2016-03-03 (×8): 2 mg via ORAL

## 2016-02-27 MED ORDER — OXYCODONE HCL ER 15 MG PO T12A
15 MG | Freq: Two times a day (BID) | ORAL | Status: DC
Start: 2016-02-27 — End: 2016-03-03
  Administered 2016-02-27 – 2016-03-03 (×12): 30 mg via ORAL

## 2016-02-27 MED FILL — TYLENOL 325 MG PO TABS: 325 MG | ORAL | Qty: 2

## 2016-02-27 MED FILL — VENLAFAXINE HCL 75 MG PO TABS: 75 MG | ORAL | Qty: 2

## 2016-02-27 MED FILL — TIZANIDINE HCL 4 MG PO TABS: 4 MG | ORAL | Qty: 2

## 2016-02-27 MED FILL — HYDROXYZINE HCL 25 MG PO TABS: 25 MG | ORAL | Qty: 1

## 2016-02-27 MED FILL — GABAPENTIN 400 MG PO CAPS: 400 MG | ORAL | Qty: 2

## 2016-02-27 MED FILL — OXYCONTIN 15 MG PO T12A: 15 MG | ORAL | Qty: 2

## 2016-02-27 MED FILL — PROMETHAZINE HCL 12.5 MG PO TABS: 12.5 MG | ORAL | Qty: 1

## 2016-02-27 MED FILL — ABILIFY 10 MG PO TABS: 10 MG | ORAL | Qty: 1

## 2016-02-27 MED FILL — NICORELIEF 2 MG MT GUM: 2 MG | OROMUCOSAL | Qty: 1
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
## 2016-02-27 NOTE — Plan of Care (Signed)
Problem: Falls - Risk of  Goal: Absence of falls  Outcome: Met This Shift  Pt remains free of falls and verbalizes understanding of individual fall risks.  Pt wearing non skid footwear and encouraged to seek out staff for any assistance needed.      Problem: Anger Management/Homicidal Ideation  Goal: STG-Able to express anger through appropriate verbalization and healthy physical outlets  Outcome: Met This Shift  No anger noted. Denies homicidal ideations.     Problem: Depressive Behavior with or without Suicide precautions  Goal: LTG-Able to verbalize acceptance of life and situations over which he or she has no control  Outcome: Met This Shift  Pt voices acceptance of life situation. Voices readiness for discharge.     Problem: Pain:  Goal: Pain level will decrease  Pain level will decrease   Outcome: Ongoing  Utilize ordered medications.   Goal: Control of acute pain  Control of acute pain   Outcome: Ongoing  Goal: Control of chronic pain  Control of chronic pain   Outcome: Ongoing

## 2016-02-27 NOTE — Plan of Care (Signed)
Problem: Anger Management/Homicidal Ideation  Goal: STG-Able to express anger through appropriate verbalization and healthy physical outlets  Outcome: Ongoing  [facility]  Initial Interdisciplinary Treatment Plan NO        Original treatment plan Date & Time: 02/27/2016 0753     Admission Type:  Admission Type: Voluntary     Reason for admission:   Reason for Admission: pt statred she waas  trick into going to the crisis center by her family she denies all she statse she has parasites and was poping eggs  pt was a little tearful on admission pt denies any drug use , She voluntary signed in  DR Elizabeth PalauK Gupta will looK, at home meds and out the in     Estimated Length of Stay:  5-7days  Estimated Discharge Date: to be determined by physician     PATIENT STRENGTHS:  Patient Strengths:Strengths: No significant Physical Illness  Patient Strengths and Limitations:Limitations: Difficult relationships / poor social skills  Addictive Behavior: Addictive Behavior  In the past 3 months, have you felt or has someone told you that you have a problem with:  : None  Do you have a history of Chemical Use?: No  Do you have a history of Alcohol Use?: No  Do you have a history of Street Drug Abuse?: No  Histroy of Prescripton Drug Abuse?: No  Medical Problems:  Past Medical History:   Diagnosis Date   ??? Anxiety     ??? Diabetes (HCC)     ??? Fibromyalgia     ??? MI, old     ??? Osteoporosis     ??? Pulmonary edema     ??? Renal failure       Status EXAM:Status and Exam  Normal: No  Facial Expression: Flat, Avoids Gaze  Affect: Blunt  Level of Consciousness: Alert  Mood:Normal: No  Mood: Depressed, Anxious, Helpless  Motor Activity:Normal: No  Motor Activity: Decreased  Interview Behavior: Cooperative  Preception: Orient to Person, Orient to Time, Orient to Place, Orient to Situation  Attention:Normal: No  Attention: Distractible, Unable to Concentrate  Thought Processes: Blocking  Thought Content:Normal: No  Thought Content:  Preoccupations  Hallucinations: None  Delusions: Yes (somatic)  Delusions: Persecution  Memory:Normal: No  Memory: Poor Recent, Poor Remote  Insight and Judgment: No  Insight and Judgment: Poor Judgment, Poor Insight, Unmotivated  Present Suicidal Ideation: No  Present Homicidal Ideation: No     EDUCATION:   Learner Progress Toward Treatment Goals: reviewed group plans and strategies for care     Method:group therapy, medication compliance, individualized assessments and care planning     Outcome: needs reinforcement     PATIENT GOALS: to be discussed with patient within 72 hours     PLAN/TREATMENT RECOMMENDATIONS:      continue group therapy , medications compliance, goal setting, individualized assessments and care, continue to monitor pt on unit        SHORT-TERM GOALS:   Time frame for Short-Term Goals: 5-7 days     LONG-TERM GOALS:  Time frame for Long-Term Goals: 6 months  Members Present in Team Meeting: See Signature Sheet     Allison Benjamin, CTRS    Problem: Depressive Behavior with or without Suicide precautions  Goal: LTG-Able to verbalize acceptance of life and situations over which he or she has no control  Outcome: Ongoing    Problem: Pain:  Goal: Pain level will decrease  Pain level will decrease   Outcome: Ongoing

## 2016-02-27 NOTE — Plan of Care (Signed)
Problem: Falls - Risk of  Goal: Absence of falls  Outcome: Ongoing  Remains free from falls.  Non skid footwear with ambulation.  q 15 min safety checks maintained and at irregular intervals.    Problem: Depressive Behavior with or without Suicide precautions  Goal: LTG-Able to verbalize acceptance of life and situations over which he or she has no control  Outcome: Ongoing  Pt denies SI/HI at this time, denies a/v hallucinations, focused on medication, isolative to self and others.  Cooperative and medication compliant.  Q 15 min safety checks maintained and at irregular intervals.

## 2016-02-27 NOTE — Progress Notes (Signed)
Psychiatric evaluation dictated    Diagnostic impression  :Psychotic disorder     Treatment plan: Medication management, supportive therapy and hospital milieu.Side effects of medications reviewed and medication education provided.    ELOS:5-7 days

## 2016-02-27 NOTE — Progress Notes (Signed)
Pt did not attend 0845 community meeting/ goals  group d/t resting in room despite staff invitation to attend.

## 2016-02-27 NOTE — Progress Notes (Signed)
Pt did not attend 1100 coping skills/ life skills group d/t resting in room despite staff invitation to attend.

## 2016-02-27 NOTE — Behavioral Health Treatment Team (Signed)
[facility]                    [address]  [place], [place]  [address]                              PSYCHIATRIC ADMISSION NOTE    PATIENT NAME:  Allison Benjamin, Allison Benjamin.                  DOB:      November 24, 1960  MED REC NO:    [id number]                           ROOM:     [id number]  ACCOUNT NO:    [id number]                        ADMISSION DATE: 02/26/2016  PHYSICIAN:     Margarie Mcguirt B. Chales Abrahams, MD                    DATE OF ADMISSION:  02/26/2016    The patient is a 55 year old white female patient who was admitted on  02/26/2016 from [facility] where she was brought with  application for emergency admission as she has been extremely agitated,  paranoid, psychotic, complaining about auditory as well as tactile  hallucinations, preoccupied with being full of parasites and non-redirectable.   She is not able to provide much information except she states that she was  diagnosed with parasites about a year ago and the doctor gave her medication  and the parasites came out of her body, but they did not come out all the way  and she still has been having problems with them.  She has history of  psychiatric problems, but not able to provide any specific history of  treatment as she goes back and forth about that.  She denies substance abuse,  but was positive for cocaine, benzodiazepines, and narcotics, and states that  she is prescribed Xanax as well as OxyContin and adamantly denies that she was  using cocaine.  She has history of chronic pain.  She denies any family  history of mental illness as she states that she was adopted and she does not  know.  She reports that she was born in [place], but grew up in [place], [place].   She graduated from high school.  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  She  states that she was married twice and has been divorced for 17 years.  She has  2 sons and a  daughter.  She currently lives with her mother.    MENTAL STATUS EXAMINATION:  She is of average height and build, uncooperative  and poorly informative as she is labile, moody and agitated.  Her thought  process is disorganized, showing looseness of association.  She is delusional  and paranoid _____.  She has auditory as well as tactile hallucinations.  She  is alert, but her orientation, memory or intellectual functions cannot be  evaluated because of her lack of cooperation and thought process  disorganization.  She has poor judgment and no insight.    DIAGNOSTIC IMPRESSION:  Psychotic disorder.    TREATMENT PLAN:  We will stabilize her  with medication management, supportive  therapy and hospital milieu.        Samariya Rockhold B. Chales AbrahamsGupta, MD      D: 02/27/2016 12:15:32  T: 02/27/2016 21:18:05  KBG/nts  Job#: [id number]  Doc#: [id number]

## 2016-02-28 MED FILL — GABAPENTIN 400 MG PO CAPS: 400 MG | ORAL | Qty: 2

## 2016-02-28 MED FILL — TIZANIDINE HCL 4 MG PO TABS: 4 MG | ORAL | Qty: 2

## 2016-02-28 MED FILL — TYLENOL 325 MG PO TABS: 325 MG | ORAL | Qty: 2

## 2016-02-28 MED FILL — NICORELIEF 2 MG MT GUM: 2 MG | OROMUCOSAL | Qty: 1

## 2016-02-28 MED FILL — HYDROXYZINE HCL 25 MG PO TABS: 25 MG | ORAL | Qty: 1

## 2016-02-28 MED FILL — ABILIFY 10 MG PO TABS: 10 MG | ORAL | Qty: 1

## 2016-02-28 MED FILL — VENLAFAXINE HCL 75 MG PO TABS: 75 MG | ORAL | Qty: 2

## 2016-02-28 MED FILL — PROMETHAZINE HCL 12.5 MG PO TABS: 12.5 MG | ORAL | Qty: 1

## 2016-02-28 MED FILL — OXYCONTIN 15 MG PO T12A: 15 MG | ORAL | Qty: 2

## 2016-02-28 NOTE — H&P (Signed)
HISTORY and PHYSICAL  [facility]       NAME:  Allison Benjamin  MRN: [id number]   Date of Birth:  [birth year]   Date: 02/28/2016   Age: 55 y.o.  Gender: female     COMPLAINT AND PRESENT HISTORY:    55 y o female admitted to [facility] related to concern about having parasites.  Patient says she spent all last summer in [place] with her granddaughter.  She says she had parasites but she did receive treatment after returning to this area.  She does however become anxious about possible return of the problem.  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  Has had inflamed vocal cords.  Denies use of alcohol.  Has smoked marijuana mostly on the weekends.  Also has been on OxyContin, Oxycodone and Xanax.  Use unknown.    DIAGNOSTIC RESULTS   Labs:  CBC: No results for input(s): WBC, HGB, PLT in the last 72 hours.  BMP:  No results for input(s): NA, K, CL, CO2, BUN, CREATININE, GLUCOSE in the last 72 hours.  Hepatic: No results for input(s): AST, ALT, ALB, BILITOT, ALKPHOS in the last 72 hours.  Lipids: No results for input(s): CHOL, HDL in the last 72 hours.    Invalid input(s): LDLCALCU  U/A:  Lab Results   Component Value Date    COLORU YELLOW 07/31/2010    WBCUA 0 TO 2 07/31/2010    RBCUA 0 TO 2 07/31/2010    MUCUS 1+ 07/31/2010    BACTERIA NOT REPORTED 07/31/2010    SPECGRAV 1.024 07/31/2010    LEUKOCYTESUR SMALL 07/31/2010    GLUCOSEU NEGATIVE 07/31/2010    AMORPHOUS NOT REPORTED 07/31/2010       PAST MEDICAL HISTORY     Past Medical History:   Diagnosis Date   ??? Anxiety    ??? Diabetes (HCC)    ??? Fibromyalgia    ??? MI, old    ??? Osteoporosis    ??? Pulmonary edema    ??? Renal failure        Pt denies any history of hypertension, stroke, heart disease, COPD, Asthma, GERD, HLD, Cancer, Seizures,Thyroid disease,  Hepatitis, TB.    SURGICAL HISTORY     3  C-Sections, Tubal ligation, hysterectomy, tracheostomy, Cholecystectomy, right and left knee arthroscopy, Total left knee replacement, left heel spur, right carpal tunnel    FAMILY HISTORY     adopted    SOCIAL HISTORY       Social History     Social History   ??? Marital status: Single     Spouse name: N/A   ??? Number of children: N/A   ??? Years of education: N/A     Social History Main Topics   ??? Smoking status: Never Smoker   ??? Smokeless tobacco: Not on file   ??? Alcohol use Not on file   ??? Drug use: Yes     Special: Cocaine, Marijuana, Opiates    ??? Sexual activity: Not on file     Other Topics Concern   ??? Not on file     Social History Narrative   ??? No narrative on file           REVIEW OF SYSTEMS      Allergies  Allergen Reactions   ??? Amitriptyline    ??? Augmentin [Amoxicillin-Pot Clavulanate]    ??? Buspar [Buspirone]    ??? Flexeril [Cyclobenzaprine]    ??? Metformin And Related    ??? Seroquel [Quetiapine]    ??? Trazodone And Nefazodone    ??? Unasyn [Ampicillin-Sulbactam Sodium]    ??? Zofran [Ondansetron Hcl]        No current facility-administered medications on file prior to encounter.      No current outpatient prescriptions on file prior to encounter.                      General health:     Poor.  No fever or chills.  Poor immune system.  Tendency for URI with abt about 2 times/month.               Skin:  No itching, redness or rash.  Lipoma left elbow, history of shingles,     Head, eyes, ears, nose, throat:  No headache, epistaxis, rhinorrhea hearing loss or sore throat.   Bell's palsy, glasses, rhinorrhea, edentulous,     Neck:  No pain, stiffness or masses.     Cardiovascular/Respiratory system:  No chest pain, palpitation, shortness of breath, coughing or expectoration.  B varicose veins,           Gastrointestinal tract: No abdominal pain, nausea, vomiting, diarrhea or constipation.  Hiatal hernia,     Genitourinary:  No burning on micturition.  No hesitancy, urgency, frequency or discoloration of urine. Kidney,  bladder infection.    Locomotor:  No bone or joint pains. No swelling or deformities.  4 herniated disc, neuropathy both legs, fibromyalgia, fracture left wrist and right elbow, Legs give out.  Cane.    Neuropsychiatric:  See HPI. Tendency for anxiety.    GENERAL PHYSICAL EXAM:     Vitals: BP 122/66   Pulse 74   Temp 98.1 ??F (36.7 ??C) (Oral)    Resp 14   Ht 5\' 6"  (1.676 m)   Wt 158 lb (71.7 kg)   LMP  (LMP Unknown)   SpO2 96%   BMI 25.5 kg/m2 Body mass index is 25.5 kg/(m^2).     Pt was examined with a nurse present in the room.     GENERAL APPEARANCE:  Allison Benjamin is 55 y.o.,Caucasian  female, not obese, nourished, conscious, alert.  Does not appear to be distress or pain at this time.          SKIN:  Warm, dry, no cyanosis or jaundice.  Tattoo,    HEAD:  Normocephalic, atraumatic, no swelling or tenderness.     EYES:  Pupils equal, reactive to light, Conjunctiva is clear, EOMs intact bil. eyelids WNL.    EARS:  No discharge, no marked hearing loss.    NOSE:  No rhinorrhea, epistaxis or septal deformity.    THROAT:  Not congested. No ulceration bleeding or discharge.     NECK:  No stiffness, trachea central.  No palpable masses or L.N.      CHEST:  Symmetrical and equal on expansion.      HEART:  Regular rate and rhythm. S1 > S2, No audible murmurs or gallops.     LUNGS:  Equal on expansion, normal breath sounds.  No adventitious sounds.      ABDOMEN:     Soft on palpation.  No localized tenderness.  No guarding or rigidity. No palpable organomegaly.    LYMPHATICS:  No palpable Lymphadenopathy.  LOCOMOTOR, BACK AND SPINE:  No deformities.   Tender lumbar area with palpation.    EXTREMITIES:  Symmetrical, no pedal edema.  Homan???s sign negative.  No discoloration or ulcerations.  Bilateral leg pain,    NEUROLOGIC:  The patient is conscious, alert, oriented, Gait and balance WNL.  No apparent focal sensory deficits. No motor deficits, muscle strength equal Bil. No facial droop, tongue protrudes centrally, no  slurring of the speech.            PROVISIONAL DIAGNOSES:      Psychosis  Past Medical History:   Diagnosis Date   ??? Anxiety    ??? Diabetes (HCC)    ??? Fibromyalgia    ??? MI, old    ??? Osteoporosis    ??? Pulmonary edema    ??? Renal failure        Patirica Longshore J Helyn Schwan, NP on 02/28/2016 at 2:23 PM

## 2016-02-28 NOTE — Plan of Care (Signed)
Problem: Anger Management/Homicidal Ideation  Goal: STG-Able to express anger through appropriate verbalization and healthy physical outlets  Outcome: Ongoing  Patient did not attend psychotherapy group despite encouragement from staff at 1000.

## 2016-02-28 NOTE — Plan of Care (Signed)
Problem: Anger Management/Homicidal Ideation  Goal: LTG-Able to display appropriate communication and problem solving  Outcome: Ongoing  Pt did not attend RT group at 1430 d/t resting in room despite staff invitation to attend.

## 2016-02-28 NOTE — Plan of Care (Signed)
Problem: Depressive Behavior with or without Suicide precautions  Goal: LTG-Able to verbalize acceptance of life and situations over which he or she has no control  Outcome: Ongoing  1:1 with pt x ten minutes.  Pt encouraged to attend unit programming and interact with peers and staff.  Pt also encouraged to tend to hygiene and ADLs.  Pt encouraged to discuss feelings with staff and feedback and reassurance provided.    Goal: STG-Able to verbalize suicidal ideations  Outcome: Ongoing  Pt denies thoughts of self harm and is agreeable to seeking out should thoughts of self harm arise.  Safe environment maintained.  Q15 minute checks for safety cont per unit policy.  Will cont to monitor for safety and provides support and reassurance as needed.

## 2016-02-28 NOTE — Consults (Signed)
H&P done.  Alferd ApaPHYLLIS J Abhijot Straughter, NP on 02/28/2016 at 2:41 PM

## 2016-02-28 NOTE — Plan of Care (Signed)
Problem: Anger Management/Homicidal Ideation  Goal: LTG-Able to display appropriate communication and problem solving  Outcome: Ongoing  Pt did not attend Recreational Therapy at 1330 d/t resting in room despite staff invitation to attend.

## 2016-02-28 NOTE — Progress Notes (Signed)
She continues to be quite labile, moody, overwhelmed,responding to internal stimuli and agitated.Hard to redirect and has poor impulse control.   Affect -  Superficial  Mood -    Labile  Thought process -  Disorganized,flight of ideas,looseness of association  Cognition -  Impaired  Memory- Recent-Adequate                 Remote-OK  Insight-Poor  Judgement-Poor                                                    Attempted to develop insight.  Medications reviewed..Side effects of medications reviewed and medication education provided    No side effects including akathisia,tremers,dystonia or orthostasis reported or observed.    Plan: Stabilization with antipsychotic and mood stabilization medications,group therapy and develop coping skills,discharge to community.    Encouraged to interact with peers  and participate in activities.     .

## 2016-02-28 NOTE — Plan of Care (Signed)
Problem: Anger Management/Homicidal Ideation  Goal: LTG-Able to display appropriate communication and problem solving  Outcome: Ongoing  Pt did not attend Community Meeting at 0900 d/t resting in room despite staff invitation to attend.

## 2016-02-28 NOTE — Plan of Care (Signed)
Problem: Depressive Behavior with or without Suicide precautions  Goal: LTG-Able to verbalize acceptance of life and situations over which he or she has no control  Outcome: Ongoing  Pt.denies SI/HI/AV at this time.  Q 15 min checks  maintained for safety.   Pt.admits anxiety 5/10.   Pt.encouraged to explore coping skills for anxiety.

## 2016-02-28 NOTE — Behavioral Health Treatment Team (Signed)
Patient sent home 2 valuable bags with her parents.

## 2016-02-28 NOTE — Plan of Care (Signed)
Problem: Anger Management/Homicidal Ideation  Goal: LTG-Able to display appropriate communication and problem solving  Outcome: Ongoing  PSYCHOEDUCATION GROUP NOTE     Date: February 28, 2016              Start Time: 1100                    End Time: 1130     Number Participants in Group:  8/16     Goal:  Patient will demonstrate increased interpersonal interaction   Topic: Cognitive/Memory skills, Socialization     Discipline Responsible:    OT   AT   SW   Nsg. x RT MHP Other         Participation Level:                  None   Minimal   x Active Listener x Interactive     Monopolizing             Participation Quality:  x Appropriate   Inappropriate   x       Attentive         Intrusive   x       Sharing         Resistant   x       Supportive         Lethargic         Affective:         x Congruent   Incongruent   Blunted   Flat     Constricted   Anxious   Elated   Angry     Labile   Depressed   Other             Cognitive:  x Alert x Oriented PPTP       Concentration   G x F   P   Attention Span   G x F   P   Short-Term Memory x G   F   P   Long-Term Memory x G   F   P   ProblemSolving/  Decision Making x G   F   P   Ability to Process  Information x G   F   P         Contributing Factors              Delusional              Hallucinating              Flight of Ideas              Other:         Modes of Intervention:  x Education x Support x Exploration     Clarifying x Problem Solving   Confrontation   x Socialization   Limit Setting x Reality Testing   x Activity   Movement   Media     Other:                  Response to Learning:    Able to verbalize current knowledge/experience     Able to verbalize/acknowledge new learning     Able to retain information     Capable of insight     Able to change behavior   x Progressing to goal     Other:          Comments:

## 2016-02-29 MED FILL — VENLAFAXINE HCL 75 MG PO TABS: 75 MG | ORAL | Qty: 2

## 2016-02-29 MED FILL — HYDROXYZINE HCL 25 MG PO TABS: 25 MG | ORAL | Qty: 1

## 2016-02-29 MED FILL — PROMETHAZINE HCL 12.5 MG PO TABS: 12.5 MG | ORAL | Qty: 1

## 2016-02-29 MED FILL — TYLENOL 325 MG PO TABS: 325 MG | ORAL | Qty: 2

## 2016-02-29 MED FILL — GABAPENTIN 400 MG PO CAPS: 400 MG | ORAL | Qty: 2

## 2016-02-29 MED FILL — TIZANIDINE HCL 4 MG PO TABS: 4 MG | ORAL | Qty: 2

## 2016-02-29 MED FILL — OXYCONTIN 15 MG PO T12A: 15 MG | ORAL | Qty: 2

## 2016-02-29 MED FILL — ABILIFY 10 MG PO TABS: 10 MG | ORAL | Qty: 1

## 2016-02-29 MED FILL — NICORELIEF 2 MG MT GUM: 2 MG | OROMUCOSAL | Qty: 1

## 2016-02-29 NOTE — Care Coordination-Inpatient (Signed)
Discharge Planning:    [facility] -  [facility] [place]    D/C location -  [facility] [employer]    Transport -   Parents or Ship brokerchildren    Pharmacy - [facility] on [address] In [place].    Scripts -  Give them to patient.    Insurance -     ??        PRIMARY INSURANCE   Payor: MEDICARE Plan: ??   Group Number: ?? Insurance Type: INDEMNITY   Subscriber Name: [name],Allison J Subscriber DOB: December 29, 1960   Subscriber ID: [id number] A ?? ??   Pat. Rel. to Subscriber: Self ?? ??   SECONDARY INSURANCE   Payor: [place]ICA[place] Plan: ??   Group Number: ?? Insurance Type: INDEMNITY   Subscriber Name: [name],Allison J Subscriber DOB: December 29, 1960   Subscriber ID: [id number] ?? ??   Pat. Rel. to Subscriber: SELF ?? ??   ??

## 2016-02-29 NOTE — Progress Notes (Signed)
Patient participated in Spirituality Group

## 2016-02-29 NOTE — Plan of Care (Signed)
Problem: Depressive Behavior with or without Suicide precautions  Goal: LTG-Able to verbalize acceptance of life and situations over which he or she has no control  Outcome: Ongoing  The patient is calm and controlled on the unit. The patient relates that her pain has been an issue for several years. The patient states that it contributes to her depression and anxiety. Non-medication alternatives to pain relief were explored with the patient.   Goal: STG-Able to verbalize suicidal ideations  Outcome: Ongoing  The patient denies any thoughts of self harm at this time. The patient reports a lessening of depression and states she feels ready for discharge. 15 minute checks maintained.

## 2016-02-29 NOTE — Plan of Care (Signed)
Problem: Anger Management/Homicidal Ideation  Goal: STG-Able to express anger through appropriate verbalization and healthy physical outlets  Outcome: Ongoing  [facility]  Day [facility] Interdisciplinary Treatment Plan NOTE     Review Date & Time: 9:45am 02/29/16     Admission Type:   Admission Type: Voluntary     Reason for admission:  Reason for Admission: pt statred she waas  trick into going to the crisis center by her family she denies all she statse she has parasites and was poping eggs  pt was a little tearful on admission pt denies any drug use , She voluntary signed in  DR Elizabeth Palau will looK, at home meds and out the in  Estimated Length of Stay:  5-7 days  Estimated Discharge Date Update:   to be determined by physician     PATIENT STRENGTHS:  Patient Strengths:Strengths: Social Skills  Patient Strengths and Limitations:Limitations: Difficulty problem solving/relies on others to help solve problems  Addictive Behavior:Addictive Behavior  In the past 3 months, have you felt or has someone told you that you have a problem with:  : None  Do you have a history of Chemical Use?: No  Do you have a history of Alcohol Use?: No  Do you have a history of Street Drug Abuse?: No  Histroy of Prescripton Drug Abuse?: No  Medical Problems:  Past Medical History:   Diagnosis Date   ??? Anxiety     ??? Diabetes (HCC)     ??? Fibromyalgia     ??? MI, old     ??? Osteoporosis     ??? Pulmonary edema     ??? Renal failure          Risk:  Fall RiskTotal: 63  Braden Scale Braden Scale Score: 22  BVC Total: 0  Change in scores:  No Changes to plan of Care:  No     Status EXAM:   Status and Exam  Normal: Yes  Facial Expression: Brightened  Affect: Appropriate  Level of Consciousness: Alert  Mood:Normal: No  Mood: Anxious  Motor Activity:Normal: Yes  Motor Activity: Decreased  Interview Behavior: Cooperative  Preception: Orient to Person, Orient to Time, Orient to Situation, Orient to Place  Attention:Normal: Yes  Attention:  Distractible, Unable to Concentrate  Thought Processes: Circumstantial  Thought Content:Normal: Yes  Thought Content: Preoccupations  Hallucinations: None  Delusions: No  Delusions: Persecution  Memory:Normal: Yes  Memory: Poor Recent, Poor Remote  Insight and Judgment: No  Insight and Judgment: Unmotivated  Present Suicidal Ideation: No  Present Homicidal Ideation: No     Daily Assessment Last Entry:   Daily Sleep (WDL): Within Defined Limits         Patient Currently in Pain: Denies  Daily Nutrition (WDL): Within Defined Limits     Patient Monitoring:  Frequency of Checks: 4 times per hour, close     Psychiatric Symptoms:   Depression Symptoms  Depression Symptoms: No problems reported or observed.  Anxiety Symptoms  Anxiety Symptoms: Generalized  Mania Symptoms  Mania Symptoms: No problems reported or observed.     Psychosis Symptoms  Delusion Type: No problems reported or observed.     Suicide Risk CSSR-S:  Have you wished you were dead or wished you could go to sleep and not wake up? : NO  Have you actually had any thoughts of killing yourself? : NO  Have you ever done anything, started to do anything, or prepared to do anything to end your  life?: NO  Change in Result:  none Change in Plan of care:  none        EDUCATION:   Learner Progress Toward Treatment Goals:   Reviewed results and recommendations of this team, Reviewed group plan and strategies, Reviewed signs, symptoms and risk of self harm and violent behavior, Reviewed goals and plan of care     Method:  small group, individual verbal education     Outcome:   Verbalized by patient but needs reinforcement to obtain goals     PATIENT GOALS:  Short term:  Learn coping skills to manage trauma.  Long term:  Maintain follow up appointments and adhere to medications.     PLAN/TREATMENT RECOMMENDATIONS UPDATE:  continue with group therapies, increased socialization, continue planning for after discharge goals, continue with medication compliance     SHORT-TERM  GOALS UPDATE:   Time frame for Short-Term Goals:  5-7 days     LONG-TERM GOALS UPDATE:   Time frame for Long-Term Goals:  6 months     Members Present in Team Meeting:   See signature sheet  Chales AbrahamsJustin A Kersten Salmons, MSW, LSW      Goal: LTG-Able to display appropriate communication and problem solving  Outcome: Ongoing

## 2016-02-29 NOTE — Behavioral Health Treatment Team (Signed)
Pt did not participate  In group at 1600due to not wanting to attend.

## 2016-02-29 NOTE — Plan of Care (Signed)
Problem: Anger Management/Homicidal Ideation  Goal: LTG-Able to display appropriate communication and problem solving  Outcome: Ongoing  Pt did not attend Recreational Therapy at 1430 d/t resting in room despite staff invitation to attend.

## 2016-02-29 NOTE — Plan of Care (Signed)
Problem: Anger Management/Homicidal Ideation  Goal: STG-Able to express anger through appropriate verbalization and healthy physical outlets  Outcome: Ongoing  Patient did not attend psychotherapy group despite encouragement from staff at 1000.

## 2016-02-29 NOTE — Plan of Care (Signed)
Problem: Anger Management/Homicidal Ideation  Goal: LTG-Able to display appropriate communication and problem solving  Outcome: Ongoing  Therapeutic Recreation Refusal  Pt did not participate in community meeting/goals at 0900 despite staff encouragement.

## 2016-02-29 NOTE — Plan of Care (Signed)
Problem: Anger Management/Homicidal Ideation  Goal: STG-Able to express anger through appropriate verbalization and healthy physical outlets  Outcome: Ongoing  Patient calm and cooperative during 1:1 interview. Out in day room social with select peers. Med compliant and behaviorally controlled. Reports anxiety 7/10 only this evening. Q6315min safety checks maintained.  Goal: LTG-Able to display appropriate communication and problem solving  Outcome: Ongoing  Patient social with peers in dayroom. Denies suicidal/homicidal ideations and hallucinations at this time. Q7415min safety checks maintained.    Problem: Depressive Behavior with or without Suicide precautions  Goal: LTG-Able to verbalize acceptance of life and situations over which he or she has no control  Outcome: Ongoing  Goal: STG-Able to verbalize suicidal ideations  Outcome: Ongoing  Patient denies suicidal ideations at this time.

## 2016-02-29 NOTE — Plan of Care (Signed)
Problem: Anger Management/Homicidal Ideation  Goal: LTG-Able to display appropriate communication and problem solving  Outcome: Ongoing  Therapeutic Recreation Refusal  Pt did not participate in Discharge Planning at 1100 despite staff encouragement.

## 2016-02-29 NOTE — Progress Notes (Signed)
Patient states that she  is feeling slightly better,denies any delusions.  Still experience low stress tolerance, irritability and anhedonia.  Affect-Brighter  .Mood -   Dysphoric  Thought process -  Poorly productive,blocking but improving  Cognition -  Improving  Memory- Recent-Adequate                Remote-OK  Orientation-Oriented to time ,place and person                                               Insight-Partial  Judgement-Superficial                                                Attempted to develop insight.  Medications reviewed.Side effects of medications reviewed and medication education provided.    No side effects including akathisia,tremers or orthostasis reported or observed.    Plan: Stabilization with antipdepressant  medications,group therapy and develop coping skills and stress management.

## 2016-03-01 MED FILL — TIZANIDINE HCL 4 MG PO TABS: 4 MG | ORAL | Qty: 2

## 2016-03-01 MED FILL — HYDROXYZINE HCL 25 MG PO TABS: 25 MG | ORAL | Qty: 1

## 2016-03-01 MED FILL — TYLENOL 325 MG PO TABS: 325 MG | ORAL | Qty: 2

## 2016-03-01 MED FILL — OXYCONTIN 15 MG PO T12A: 15 MG | ORAL | Qty: 2

## 2016-03-01 MED FILL — PROMETHAZINE HCL 12.5 MG PO TABS: 12.5 MG | ORAL | Qty: 1

## 2016-03-01 MED FILL — ABILIFY 10 MG PO TABS: 10 MG | ORAL | Qty: 1

## 2016-03-01 MED FILL — GABAPENTIN 400 MG PO CAPS: 400 MG | ORAL | Qty: 2

## 2016-03-01 MED FILL — VENLAFAXINE HCL 75 MG PO TABS: 75 MG | ORAL | Qty: 2

## 2016-03-01 NOTE — Plan of Care (Signed)
Problem: Anger Management/Homicidal Ideation  Goal: LTG-Able to display appropriate communication and problem solving  Outcome: Ongoing  PSYCHOEDUCATION GROUP NOTE     Date: March 01, 2016              Start Time: 1100                    End Time: 1130     Number Participants in Group:  6/11     Goal:  Patient will demonstrate increased interpersonal interaction   Topic: Self-Esteem/Positive Thinking      Discipline Responsible:    OT   AT   SW   Nsg. x RT MHP Other         Participation Level:                  None   Minimal   x Active Listener x Interactive     Monopolizing             Participation Quality:  x Appropriate   Inappropriate   x       Attentive         Intrusive   x       Sharing         Resistant           Supportive         Lethargic         Affective:         x Congruent   Incongruent   Blunted   Flat     Constricted   Anxious   Elated   Angry     Labile   Depressed   Other             Cognitive:  x Alert x Oriented PPTP       Concentration x G   F   P   Attention Span x G   F   P   Short-Term Memory x G   F   P   Long-Term Memory x G   F   P   ProblemSolving/  Decision Making x G   F   P   Ability to Process  Information x G   F   P         Contributing Factors              Delusional              Hallucinating              Flight of Ideas              Other:         Modes of Intervention:  x Education   Support   Exploration     Clarifying x Problem Solving   Confrontation   x Socialization   Limit Setting x Reality Testing   x Activity   Movement   Media     Other:                  Response to Learning:    Able to verbalize current knowledge/experience     Able to verbalize/acknowledge new learning     Able to retain information     Capable of insight     Able to change behavior   x Progressing to goal     Other:          Comments:

## 2016-03-01 NOTE — Behavioral Health Treatment Team (Signed)
PRN atarax given for anxiety. Will continue to monitor

## 2016-03-01 NOTE — Behavioral Health Treatment Team (Signed)
Psychoeducation Group Note    Date: 03/01/16  Start Time: 1600  End Time: 1630    Number Participants in Group:  7/10    Goal:  Patient will demonstrate increased interpersonal interaction   Topic: positive affermations    Discipline Responsible:   OT  AT  SW X Nsg.  RT  Other       Participation Level:     None  Minimal   X Active Listener X Interactive    Monopolizing         Participation Quality:  X Appropriate  Inappropriate   X       Attentive        Intrusive   X       Sharing        Resistant          Supportive        Lethargic       Affective:   X Congruent  Incongruent  Blunted  Flat    Constricted  Anxious  Elated  Angry    Labile  Depressed  Other         Cognitive:  X Alert X Oriented PPTP     Concentration X G  F  P   Attention Span X G  F  P   Short-Term Memory X G  F  P   Long-Term Memory X G  F  P   ProblemSolving/  Decision Making X G  F  P   Ability to Process  Information X G  F  P      Contributing Factors             Delusional             Hallucinating             Flight of Ideas             Other:       Modes of Intervention:   Education  Support  Exploration    Clarifying  Problem Solving  Confrontation   X Socialization  Limit Setting  Reality Testing    Activity  Movement  Media    Other:            Response to Learning:   Able to verbalize current knowledge/experience    Able to verbalize/acknowledge new learning    Able to retain information   X Capable of insight    Able to change behavior    Progressing to goal    Other:        Comments:

## 2016-03-01 NOTE — Plan of Care (Signed)
Problem: Anger Management/Homicidal Ideation  Goal: STG-Able to express anger through appropriate verbalization and healthy physical outlets  Outcome: Ongoing  PSYCHOTHERAPY GROUP NOTE     Date: 03/01/16              Start Time: 1000                    End Time: 1050     Number Participants in Group:  7/11     Goal:  Patient will demonstrate increased interpersonal interaction   Topic: Managing proper communication with support systems: Identifying realistic and current issues and processing them.     Discipline Responsible:    OT   AT x SW   Nsg.   RT MHP Other         Participation Level:                  None   Minimal   x Active Listener x Interactive     Monopolizing             Participation Quality:    Appropriate   Inappropriate   x       Attentive         Intrusive   x       Sharing         Resistant   x       Supportive         Lethargic         Affective:         x Congruent   Incongruent   Blunted   Flat     Constricted   Anxious   Elated   Angry     Labile   Depressed   Other             Cognitive:  x Alert x Oriented PPTP       Concentration x G   F   P   Attention Span x G   F   P   Short-Term Memory   G   F   P   Long-Term Memory   G   F   P   ProblemSolving/  Decision Making x G   F   P   Ability to Process  Information x G   F   P         Contributing Factors              Delusional              Hallucinating              Flight of Ideas              Other:         Modes of Intervention:  x Education x Support x Exploration   x Clarifying x Problem Solving   Confrontation   x Socialization   Limit Setting   Reality Testing     Activity   Movement   Media     Other:                  Response to Learning:  x Able to verbalize current knowledge/experience   x Able to verbalize/acknowledge new learning     Able to retain information   x Capable of insight     Able to change behavior   x Progressing to goal     Other:  Comments:

## 2016-03-01 NOTE — Plan of Care (Signed)
Problem: Falls - Risk of  Goal: Absence of falls  Outcome: Ongoing  Patient remains free of falls while on unit.    Problem: Pain:  Goal: Pain level will decrease  Pain level will decrease   Outcome: Ongoing  Patient denies pain at this time.    Problem: Altered Mood, Depressive Behavior  Goal: LTG-Able to verbalize and/or display a decrease in depressive symptoms  Outcome: Ongoing  Patient reports depression and anxiety 8/10 this evening. Isolative to self in room most of evening. Out for needs only. Calm and cooperative during 1:1 interview. Q6315min safety checks maintained.  Goal: STG-Able to verbalize suicidal ideations  Outcome: Ongoing  Patient denies suicidal/homicidal ideations at this time. Patient denies hallucinations at the time. Q5915min safety checks maintained.

## 2016-03-01 NOTE — Progress Notes (Signed)
Patient participated in walking group from 0915-0930.

## 2016-03-01 NOTE — Plan of Care (Signed)
Problem: Depressive Behavior with or without Suicide precautions  Goal: LTG-Able to verbalize acceptance of life and situations over which he or she has no control  Outcome: Ongoing  PSYCHOEDUCATION GROUP NOTE     Date: March 01, 2016              Start Time: 0900                    End Time: 0915     Number Participants in Group:  8/12     Goal:  Patient will demonstrate increased interpersonal interaction   Topic: Community Meeting/Goals     Discipline Responsible:    OT   AT   SW   Nsg. x RT MHP Other         Participation Level:                  None   Minimal   x Active Listener x Interactive     Monopolizing             Participation Quality:  x Appropriate   Inappropriate   x       Attentive         Intrusive   x       Sharing         Resistant           Supportive         Lethargic         Affective:         x Congruent   Incongruent   Blunted   Flat     Constricted   Anxious   Elated   Angry     Labile   Depressed   Other             Cognitive:  x Alert x Oriented PPTP       Concentration   G x F   P   Attention Span   G x F   P   Short-Term Memory x G   F   P   Long-Term Memory x G   F   P   ProblemSolving/  Decision Making x G   F   P   Ability to Process  Information x G   F   P         Contributing Factors              Delusional              Hallucinating              Flight of Ideas              Other:         Modes of Intervention:  x Education x Support x Exploration     Clarifying x Problem Solving   Confrontation   x Socialization   Limit Setting x Reality Testing     Activity   Movement   Media     Other:                  Response to Learning:    Able to verbalize current knowledge/experience     Able to verbalize/acknowledge new learning     Able to retain information     Capable of insight     Able to change behavior   x Progressing to goal     Other:  Comments:

## 2016-03-01 NOTE — Plan of Care (Signed)
Problem: Altered Mood, Depressive Behavior  Goal: LTG-Able to verbalize and/or display a decrease in depressive symptoms  Outcome: Ongoing  PSYCHOEDUCATION GROUP NOTE     Date: March 01, 2016              Start Time: 1330                    End Time: 1405     Number Participants in Group:  6/11     Goal:  Patient will demonstrate increased interpersonal interaction   Topic: Self-Awareness, Emotions, Socialization     Discipline Responsible:    OT   AT   SW   Nsg. x RT MHP Other         Participation Level:                  None   Minimal   x Active Listener x Interactive     Monopolizing             Participation Quality:  x Appropriate   Inappropriate   x       Attentive         Intrusive   x       Sharing         Resistant   x       Supportive         Lethargic         Affective:         x Congruent   Incongruent   Blunted   Flat     Constricted   Anxious   Elated   Angry     Labile   Depressed   Other             Cognitive:  x Alert x Oriented PPTP       Concentration x G   F   P   Attention Span x G   F   P   Short-Term Memory x G   F   P   Long-Term Memory x G   F   P   ProblemSolving/  Decision Making x G   F   P   Ability to Process  Information x G   F   P         Contributing Factors              Delusional              Hallucinating              Flight of Ideas              Other:         Modes of Intervention:  x Education x Support x Exploration   x Clarifying   Problem Solving   Confrontation   x Socialization   Limit Setting x Reality Testing   x Activity   Movement   Media     Other:                  Response to Learning:  x Able to verbalize current knowledge/experience   x Able to verbalize/acknowledge new learning     Able to retain information     Capable of insight     Able to change behavior   x Progressing to goal     Other:          Comments:

## 2016-03-01 NOTE — Plan of Care (Signed)
Problem: Altered Mood, Depressive Behavior  Goal: LTG-Able to verbalize and/or display a decrease in depressive symptoms  Outcome: Ongoing  Goal: STG-Able to verbalize suicidal ideations  Outcome: Ongoing  During 1:1 interview patient denies all and appears brightened. Pt social on unit with staff and peers. Pt attending groups and remains medication compliant. Pt reports she plans to be discharged tomorrow. Support and encouragement given. 15 minute checks continue per unit policy for safety. Will continue to monitor

## 2016-03-01 NOTE — Plan of Care (Signed)
Problem: Altered Mood, Depressive Behavior  Goal: LTG-Able to verbalize and/or display a decrease in depressive symptoms  Outcome: Ongoing  PSYCHOEDUCATION GROUP NOTE     Date: March 01, 2016              Start Time: 1430                    End Time: 1510     Number Participants in Group:  4/10     Goal:  Patient will demonstrate increased interpersonal interaction   Topic: Nurture: Mind, Body & Spirit/Stretching      Discipline Responsible:    OT   AT   SW   Nsg. x RT MHP Other         Participation Level:                  None   Minimal   x Active Listener x Interactive     Monopolizing             Participation Quality:  x Appropriate   Inappropriate   x       Attentive         Intrusive   x       Sharing         Resistant           Supportive         Lethargic         Affective:         x Congruent   Incongruent   Blunted   Flat     Constricted   Anxious   Elated   Angry     Labile   Depressed   Other             Cognitive:  x Alert x Oriented PPTP       Concentration x G   F   P   Attention Span x G   F   P   Short-Term Memory x G   F   P   Long-Term Memory x G   F   P   ProblemSolving/  Decision Making x G   F   P   Ability to Process  Information x G   F   P         Contributing Factors              Delusional              Hallucinating              Flight of Ideas              Other:         Modes of Intervention:  x Education   Support   Exploration     Clarifying x Problem Solving   Confrontation   x Socialization   Limit Setting x Reality Testing   x Activity x Movement   Media     Other:                  Response to Learning:    Able to verbalize current knowledge/experience     Able to verbalize/acknowledge new learning     Able to retain information     Capable of insight     Able to change behavior   x Progressing to goal     Other:  Comments:

## 2016-03-01 NOTE — Behavioral Health Treatment Team (Signed)
PRN zanaflex given for muscle spasms in back. Will continue to monitor

## 2016-03-02 MED ORDER — GABAPENTIN 400 MG PO CAPS
400 MG | ORAL_CAPSULE | Freq: Three times a day (TID) | ORAL | 0 refills | Status: AC
Start: 2016-03-02 — End: ?

## 2016-03-02 MED ORDER — ARIPIPRAZOLE 10 MG PO TABS
10 MG | ORAL_TABLET | Freq: Every day | ORAL | 0 refills | Status: AC
Start: 2016-03-02 — End: ?

## 2016-03-02 MED ORDER — NICOTINE POLACRILEX 2 MG MT GUM
2 | OROMUCOSAL | 0 refills | Status: AC | PRN
Start: 2016-03-02 — End: ?

## 2016-03-02 MED ORDER — HYDROXYZINE HCL 25 MG PO TABS
25 MG | ORAL_TABLET | Freq: Three times a day (TID) | ORAL | 0 refills | Status: AC | PRN
Start: 2016-03-02 — End: 2016-03-12

## 2016-03-02 MED ORDER — VENLAFAXINE HCL 75 MG PO TABS
75 MG | ORAL_TABLET | Freq: Two times a day (BID) | ORAL | 0 refills | Status: AC
Start: 2016-03-02 — End: ?

## 2016-03-02 MED FILL — TIZANIDINE HCL 4 MG PO TABS: 4 MG | ORAL | Qty: 2

## 2016-03-02 MED FILL — VENLAFAXINE HCL 75 MG PO TABS: 75 MG | ORAL | Qty: 2

## 2016-03-02 MED FILL — GABAPENTIN 400 MG PO CAPS: 400 MG | ORAL | Qty: 2

## 2016-03-02 MED FILL — OXYCONTIN 15 MG PO T12A: 15 MG | ORAL | Qty: 2

## 2016-03-02 MED FILL — PROMETHAZINE HCL 12.5 MG PO TABS: 12.5 MG | ORAL | Qty: 1

## 2016-03-02 MED FILL — HYDROXYZINE HCL 25 MG PO TABS: 25 MG | ORAL | Qty: 1

## 2016-03-02 MED FILL — NICORELIEF 2 MG MT GUM: 2 MG | OROMUCOSAL | Qty: 1

## 2016-03-02 MED FILL — ABILIFY 10 MG PO TABS: 10 MG | ORAL | Qty: 1

## 2016-03-02 MED FILL — TYLENOL 325 MG PO TABS: 325 MG | ORAL | Qty: 2

## 2016-03-02 NOTE — Plan of Care (Signed)
Problem: Altered Mood, Depressive Behavior  Goal: LTG-Able to verbalize and/or display a decrease in depressive symptoms  Outcome: Ongoing  PSYCHOTHERAPY GROUP NOTE     Date: 03/02/16              Start Time: 1000                    End Time: 0150     Number Participants in Group:  7     Goal:  Patient will demonstrate increased interpersonal interaction   Topic: identifying supports     Discipline Responsible:    OT   AT x SW   Nsg.   RT MHP Other         Participation Level:                  None   Minimal   x Active Listener x Interactive     Monopolizing             Participation Quality:    Appropriate   Inappropriate   x       Attentive         Intrusive   x       Sharing         Resistant   x       Supportive         Lethargic         Affective:         x Congruent   Incongruent   Blunted   Flat     Constricted   Anxious   Elated   Angry     Labile   Depressed   Other             Cognitive:  x Alert x Oriented PPTP       Concentration x G   F   P   Attention Span x G   F   P   Short-Term Memory   G   F   P   Long-Term Memory   G   F   P   ProblemSolving/  Decision Making   G x F   P   Ability to Process  Information x G   F   P         Contributing Factors              Delusional              Hallucinating              Flight of Ideas              Other:         Modes of Intervention:  x Education x Support x Exploration   x Clarifying x Problem Solving   Confrontation   x Socialization   Limit Setting   Reality Testing     Activity   Movement   Media     Other:                  Response to Learning:  x Able to verbalize current knowledge/experience   x Able to verbalize/acknowledge new learning     Able to retain information   x Capable of insight     Able to change behavior   x Progressing to goal     Other:          Comments:

## 2016-03-02 NOTE — Progress Notes (Signed)
She has been feeling less anxious,overwhelmed and despondent.More reality based thinking.  .Affect-Brighter  .Mood -   Dysphoric  Thought process -  Poorly productive,blocking but improving  Thought content-No suicidal feelings,low self esteem  Cognition -  Intact  Memory- Recent-Adequate                Remote-OK  Orientation-OK                                               Insight-Partial  Judgement-Superficial                                                Attempted to develop insight.  Medications reviewed.Side effects of medications reviewed and medication education provided.No side effects including akathisia,tremers,dystonia or orthostasis reported or observed.    Plan: Stabilization with antidepressant and mood stabilization medications,group therapy and develop coping skills,discharge to community.    .Marland Kitchen

## 2016-03-02 NOTE — Plan of Care (Signed)
Problem: Altered Mood, Depressive Behavior  Goal: LTG-Able to verbalize and/or display a decrease in depressive symptoms  Outcome: Ongoing  PSYCHOEDUCATION GROUP NOTE     Date: March 02, 2016              Start Time: 0900                    End Time: 0915     Number Participants in Group:  8/12     Goal:  Patient will demonstrate increased interpersonal interaction   Topic: Community Meeting/Goals     Discipline Responsible:    OT   AT   SW   Nsg. x RT MHP Other         Participation Level:                  None   Minimal   x Active Listener x Interactive     Monopolizing             Participation Quality:  x Appropriate   Inappropriate   x       Attentive         Intrusive   x       Sharing         Resistant           Supportive         Lethargic         Affective:         x Congruent   Incongruent   Blunted   Flat     Constricted   Anxious   Elated   Angry     Labile   Depressed   Other             Cognitive:  x Alert x Oriented PPTP       Concentration   G x F   P   Attention Span   G x F   P   Short-Term Memory x G   F   P   Long-Term Memory x G   F   P   ProblemSolving/  Decision Making x G   F   P   Ability to Process  Information x G   F   P         Contributing Factors              Delusional              Hallucinating              Flight of Ideas              Other:         Modes of Intervention:  x Education x Support x Exploration     Clarifying x Problem Solving   Confrontation   x Socialization   Limit Setting x Reality Testing     Activity   Movement   Media     Other:                  Response to Learning:    Able to verbalize current knowledge/experience     Able to verbalize/acknowledge new learning     Able to retain information     Capable of insight     Able to change behavior   x Progressing to goal     Other:          Comments:

## 2016-03-02 NOTE — Behavioral Health Treatment Team (Signed)
Patient at desk stating she has flu like symptoms. Educated patient that her symptoms are consistent with withdrawal symptoms. Patient un-accepting that symptoms are withdrawal symptoms and became verbally aggressive and upset towards nurse.

## 2016-03-02 NOTE — Plan of Care (Signed)
Problem: Altered Mood, Depressive Behavior  Goal: LTG-Able to verbalize and/or display a decrease in depressive symptoms  Outcome: Met This Shift  Pt during 1:1 related that she is not depressed but is anxious.  Pt informed she is anxious because she is ready to go home.  Pr is going to be discharged tomorrow; Friday 6/23.  Pt will be going to [facility] for out patient therapy.  Support/positive reinforcement given.  Goal: STG-Able to verbalize suicidal ideations  Outcome: Met This Shift  Pt denies SI and has remained free from self harm this shift.  Will continue to monitor for a change in behavior.  Q 15 min checks per Unit Policy maintained for pt safety.

## 2016-03-02 NOTE — Plan of Care (Signed)
Problem: Altered Mood, Depressive Behavior  Goal: LTG-Able to verbalize and/or display a decrease in depressive symptoms  Outcome: Ongoing  PSYCHOEDUCATION GROUP NOTE     Date: March 02, 2016              Start Time: 1100                    End Time: 1130     Number Participants in Group:  8/12     Goal:  Patient will demonstrate increased interpersonal interaction   Topic: Leisure Awareness/Decision Making      Discipline Responsible:    OT   AT   SW   Nsg. x RT MHP Other         Participation Level:                  None   Minimal   x Active Listener x Interactive     Monopolizing             Participation Quality:  x Appropriate   Inappropriate   x       Attentive         Intrusive           Sharing         Resistant           Supportive         Lethargic         Affective:         x Congruent   Incongruent   Blunted   Flat     Constricted   Anxious   Elated   Angry     Labile   Depressed   Other             Cognitive:  x Alert x Oriented PPTP       Concentration x G   F   P   Attention Span x G   F   P   Short-Term Memory x G   F   P   Long-Term Memory x G   F   P   ProblemSolving/  Decision Making x G   F   P   Ability to Process  Information x G   F   P         Contributing Factors              Delusional              Hallucinating              Flight of Ideas              Other:         Modes of Intervention:  x Education   Support   Exploration     Clarifying x Problem Solving   Confrontation   x Socialization   Limit Setting x Reality Testing   x Activity x Movement   Media     Other:                  Response to Learning:    Able to verbalize current knowledge/experience     Able to verbalize/acknowledge new learning     Able to retain information     Capable of insight     Able to change behavior   x Progressing to goal     Other:  Comments:

## 2016-03-02 NOTE — Plan of Care (Signed)
Problem: Altered Mood, Depressive Behavior  Goal: LTG-Able to verbalize and/or display a decrease in depressive symptoms  Outcome: Ongoing  PSYCHOEDUCATION GROUP NOTE     Date: March 02, 2016              Start Time: 1430                    End Time: 1520     Number Participants in Group:  8/12     Goal:  Patient will demonstrate increased interpersonal interaction   Topic: Coping Skills     Discipline Responsible:    OT   AT   SW   Nsg. x RT MHP Other         Participation Level:                  None   Minimal   x Active Listener x Interactive     Monopolizing             Participation Quality:  x Appropriate   Inappropriate   x       Attentive         Intrusive   x       Sharing         Resistant           Supportive         Lethargic         Affective:         x Congruent   Incongruent   Blunted   Flat     Constricted   Anxious   Elated   Angry     Labile   Depressed   Other             Cognitive:  x Alert x Oriented PPTP       Concentration   G x F   P   Attention Span   G x F   P   Short-Term Memory x G   F   P   Long-Term Memory x G   F   P   ProblemSolving/  Decision Making x G   F   P   Ability to Process  Information x G   F   P         Contributing Factors              Delusional              Hallucinating              Flight of Ideas              Other:         Modes of Intervention:  x Education x Support x Exploration   x Clarifying x Problem Solving   Confrontation   x Socialization   Limit Setting x Reality Testing   x Activity   Movement   Media     Other:                  Response to Learning:    Able to verbalize current knowledge/experience     Able to verbalize/acknowledge new learning     Able to retain information     Capable of insight     Able to change behavior   x Progressing to goal     Other:          Comments:

## 2016-03-02 NOTE — Plan of Care (Signed)
Problem: Falls - Risk of  Goal: Absence of falls  Outcome: Ongoing  Pt remains free of falls and verbalizes understanding of individual fall risks.  Pt wearing non skid footwear and encouraged to seek out staff for any assistance needed.      Problem: Pain:  Goal: Pain level will decrease  Pain level will decrease   Outcome: Ongoing  Pt continues to voices chronic pain. Utilize ordered medications.   Goal: Control of acute pain  Control of acute pain   Outcome: Ongoing  Goal: Control of chronic pain  Control of chronic pain   Outcome: Ongoing    Problem: Altered Mood, Depressive Behavior  Goal: LTG-Able to verbalize and/or display a decrease in depressive symptoms  Outcome: Ongoing  Pt denies suicidal ideations. No self harm noted. Voices feeling better. Decreased depression. Voices readiness for discharge.   Goal: STG-Able to verbalize suicidal ideations  Outcome: Ongoing

## 2016-03-02 NOTE — Other (Signed)
03/02/16 1533   Encounter Summary   Services provided to: Patient   Continue Visiting (03/02/16)   Complexity of Encounter Low   Length of Encounter 30 minutes   Routine   Type Follow up   Assessment Calm;Approachable   Intervention Active listening;Nurtured hope;Prayer;Scripture;Provided reading materials/devotional materials   Outcome Receptive;Engaged in conversation;Expressed gratitude

## 2016-03-03 MED FILL — VENLAFAXINE HCL 75 MG PO TABS: 75 MG | ORAL | Qty: 2

## 2016-03-03 MED FILL — GABAPENTIN 400 MG PO CAPS: 400 MG | ORAL | Qty: 2

## 2016-03-03 MED FILL — OXYCONTIN 15 MG PO T12A: 15 MG | ORAL | Qty: 2

## 2016-03-03 MED FILL — TYLENOL 325 MG PO TABS: 325 MG | ORAL | Qty: 2

## 2016-03-03 MED FILL — TIZANIDINE HCL 4 MG PO TABS: 4 MG | ORAL | Qty: 2

## 2016-03-03 MED FILL — HYDROXYZINE HCL 25 MG PO TABS: 25 MG | ORAL | Qty: 1

## 2016-03-03 MED FILL — NICORELIEF 2 MG MT GUM: 2 MG | OROMUCOSAL | Qty: 1

## 2016-03-03 MED FILL — PROMETHAZINE HCL 12.5 MG PO TABS: 12.5 MG | ORAL | Qty: 1

## 2016-03-03 MED FILL — ABILIFY 10 MG PO TABS: 10 MG | ORAL | Qty: 1

## 2016-03-03 NOTE — Plan of Care (Signed)
Problem: Altered Mood, Depressive Behavior  Goal: LTG-Able to verbalize and/or display a decrease in depressive symptoms  Outcome: Ongoing  PSYCHOEDUCATION GROUP NOTE     Date: March 03, 2016              Start Time: 0900                    End Time: 0920     Number Participants in Group:  9/15     Goal:  Patient will demonstrate increased interpersonal interaction   Topic: Chief Financial OfficerCommunity Meeting      Discipline Responsible:    OT   AT   SW   Nsg. x RT MHP Other         Participation Level:                  None   Minimal   x Active Listener x Interactive     Monopolizing             Participation Quality:  x Appropriate   Inappropriate           Attentive         Intrusive   x       Sharing         Resistant           Supportive         Lethargic         Affective:         x Congruent   Incongruent   Blunted   Flat     Constricted   Anxious   Elated   Angry     Labile   Depressed   Other             Cognitive:  x Alert x Oriented PPTP       Concentration x G   F   P   Attention Span x G   F   P   Short-Term Memory x G   F   P   Long-Term Memory x G   F   P   ProblemSolving/  Decision Making x G   F   P   Ability to Process  Information x G   F   P         Contributing Factors              Delusional              Hallucinating              Flight of Ideas              Other:         Modes of Intervention:  x Education   Support   Exploration     Clarifying x Problem Solving   Confrontation   x Socialization   Limit Setting x Reality Testing   x Activity   Movement   Media     Other:                  Response to Learning:    Able to verbalize current knowledge/experience     Able to verbalize/acknowledge new learning     Able to retain information     Capable of insight     Able to change behavior   x Progressing to goal     Other:  Comments:

## 2016-03-03 NOTE — Plan of Care (Signed)
Problem: Falls - Risk of  Goal: Absence of falls  Outcome: Completed Date Met:  03/03/16  Pt remains free of falls and verbalizes understanding of individual fall risks.  Pt wearing non skid footwear and encouraged to seek out staff for any assistance needed.      Problem: Pain:  Goal: Pain level will decrease  Pain level will decrease   Outcome: Completed Date Met:  03/03/16  Continued use of medications for chronic pain management.   Goal: Control of acute pain  Control of acute pain   Outcome: Completed Date Met:  03/03/16  Goal: Control of chronic pain  Control of chronic pain   Outcome: Completed Date Met:  03/03/16    Problem: Altered Mood, Depressive Behavior  Goal: LTG-Able to verbalize and/or display a decrease in depressive symptoms  Outcome: Completed Date Met:  03/03/16  Pt denies depression. Denies suicidal or homicidal ideations. Denies audio/visual hallucinations. Pt voices readiness for discharge.   Goal: STG-Able to verbalize suicidal ideations  Outcome: Completed Date Met:  03/03/16

## 2016-03-03 NOTE — Care Coordination-Inpatient (Signed)
Bridge Appointment completed: Reviewed Discharge Instructions with patient.    Patient verbalizes understanding and agreement with the discharge plan using the teachback method.       Discharge Arrangements:    Guardian notified:  n/a  Discharge destination/address: own residence  Transported by:  cab

## 2016-03-03 NOTE — Behavioral Health Treatment Team (Signed)
[facility]  Discharge Note    Pt discharged with followings belongings:   Dentures: None  Vision - Corrective Lenses: Glasses  Hearing Aid: None  Jewelry: Ring, Earrings, Necklace, Bracelet  Body Piercings Removed: N/A  Clothing: Footwear, Shirt, Undergarments (Comment)  Were All Patient Medications Collected?: Yes  Other Valuables: Cell phone, Money (Comment), Purse, Wallet, Cane   Valuables sent home with parents previously. Valuables retrieved from safe, Security envelope number:  n/a and returned to patient.  Patient left department with Departure Mode: With parents via Mobility at Departure: Ambulatory, discharged to Discharged to: Private Residence. Patient education on aftercare instructions: Yes  [facility] by RN Patient verbalize understanding of AVS:  yes.    Status EXAM upon discharge:  Status and Exam  Normal: Yes  Facial Expression: Brightened  Affect: Appropriate  Level of Consciousness: Alert  Mood:Normal: Yes  Mood: Anxious  Motor Activity:Normal: Yes  Motor Activity: Decreased  Interview Behavior: Cooperative  Preception: Orient to Person, Orient to Time, Orient to Place, Orient to Situation  Attention:Normal: Yes  Attention: Distractible, Unable to Concentrate  Thought Processes: Circumstantial  Thought Content:Normal: Yes  Thought Content: Preoccupations  Hallucinations: None  Delusions: No  Delusions: Persecution  Memory:Normal: Yes  Memory: Poor Recent, Poor Remote  Insight and Judgment: No  Insight and Judgment: Poor Insight  Present Suicidal Ideation: No  Present Homicidal Ideation: No    Raiford Nobleenee J Somya Jauregui, RN

## 2016-03-03 NOTE — Progress Notes (Signed)
Patient participated in walking group at 0930.

## 2016-03-03 NOTE — Plan of Care (Signed)
PSYCHOTHERAPY GROUP NOTE    Date: 03/03/16  Start Time: 1000  End Time: 1050    Number Participants in Group:  7/15    Goal:  Patient will demonstrate increased interpersonal interaction   Topic: managing supports and proper/effective communication    Discipline Responsible:   OT  AT x SW  Nsg.  RT MHP Other       Participation Level:     None  Minimal   x Active Listener x Interactive    Monopolizing         Participation Quality:   Appropriate  Inappropriate   x       Attentive        Intrusive   x       Sharing        Resistant   x       Supportive        Lethargic       Affective:   x Congruent  Incongruent  Blunted  Flat    Constricted  Anxious  Elated  Angry    Labile  Depressed  Other         Cognitive:  x Alert x Oriented PPTP     Concentration x G  F  P   Attention Span x G  F  P   Short-Term Memory  G  F  P   Long-Term Memory  G  F  P   ProblemSolving/  Decision Making x G  F  P   Ability to Process  Information x G  F  P      Contributing Factors             Delusional             Hallucinating             Flight of Ideas             Other:       Modes of Intervention:  x Education x Support x Exploration   x Clarifying x Problem Solving  Confrontation   x Socialization  Limit Setting x Reality Testing   x Activity  Movement  Media    Other:            Response to Learning:  x Able to verbalize current knowledge/experience   x Able to verbalize/acknowledge new learning    Able to retain information   x Capable of insight    Able to change behavior   x Progressing to goal    Other:        Comments:

## 2016-03-03 NOTE — Discharge Instructions (Addendum)
???   Good nutrition is important when healing from an illness, injury, or surgery.  Follow any nutrition recommendations given to you during your hospital stay.   ??? If you were given an oral nutrition supplement while in the hospital, continue to take this supplement at home.  You can take it with meals, in-between meals, and/or before bedtime. These supplements can be purchased at most local grocery stores, pharmacies, and chain super-stores.   ??? If you have any questions about your diet or nutrition, call the hospital and ask for the dietitian.  Carbohydrate controlled diet

## 2016-03-03 NOTE — Progress Notes (Signed)
Final Diagnosis :Same as admission diagnosis    She is doing fairly well and not in any distress.Thought process is organized and has insight into compliance with treatment.Side effects of medications reviewed and medication education provided.Smoking cessation information was provided.     Making rational and realistic plans so being discharged. Follow up at [facility].

## 2016-03-03 NOTE — Behavioral Health Treatment Team (Signed)
Patient given tobacco quitline number 1-800-784-8669 at this time. With nurse observation patient called number for information and follow up. Continue to reinforce the dangers of long term tobacco use and why tobacco cessation is important to patient. Byron Tipping J Bernita Beckstrom, RN

## 2016-03-03 NOTE — Discharge Summary (Signed)
[facility]                    [address]  [place], [place]  [id number]                                   DISCHARGE SUMMARY    PATIENT NAME:  Allison Benjamin, Allison J.                  DOB:      September 25, 1960  MED REC NO:    [id number]                           ROOM:     [facility] [id number]  ACCOUNT NO:    [id number]                        ADMISSION DATE: 02/26/2016  PHYSICIAN:     Chaquana Nichols B. Chales AbrahamsGupta, MD                 DISCHARGE DATE: 03/03/2016      Please see admission psychiatric evaluation as the first part of discharge  summary.    COURSE OF HOSPITALIZATION:  Her lab workup including CBC, differential,  urinalysis and blood chemistry were ordered, but she refused blood workup.   She was started on antipsychotic medications and was involved in individual  supportive therapy and hospital milieu.  She responded well to this treatment  regimen.  Her mood as well as reality testing improved.  She was making  rational and realistic plans and was not in any distress, so was discharged as  improved.    FINAL DIAGNOSIS:  Psychotic disorder.    DISCHARGE MEDICATIONS:  Please see discharge medication list.    FOLLOWUP:  [facility].        Brexton Sofia B. Chales AbrahamsGupta, MD      D: 03/03/2016 18:53:24  T: 03/04/2016 14:43:24  KBG/nts  Job#: [id number]  Doc#: [id number]

## 2016-03-03 NOTE — Discharge Instructions (Signed)
Please keep all follow up appointments. Take all medications as prescribed. Avoid alcohol and street drugs.

## 2016-03-03 NOTE — Discharge Instructions (Signed)
As tolerated. Utilize cane as needed.

## 2016-04-27 IMAGING — DX DG LUMBAR SPINE COMPLETE 4+V
5 series · 5 of 5 positions shown · non-contrast
Comparison: CT abdomen/pelvis 09/24/2013

CLINICAL DATA: 53-year-old female status post fall 4 days ago with
persistent lumbar spine pain

EXAM:
LUMBAR SPINE - COMPLETE 4+ VIEW

[l-spine ap]
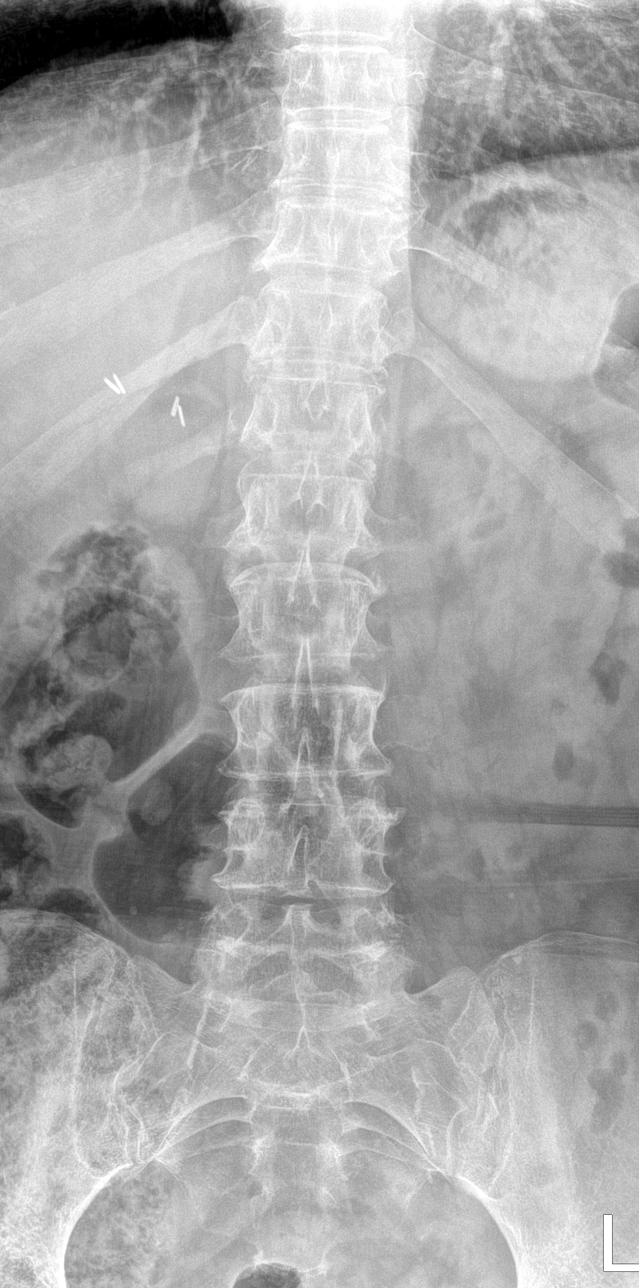

[l-spine obl (1 of 2)]
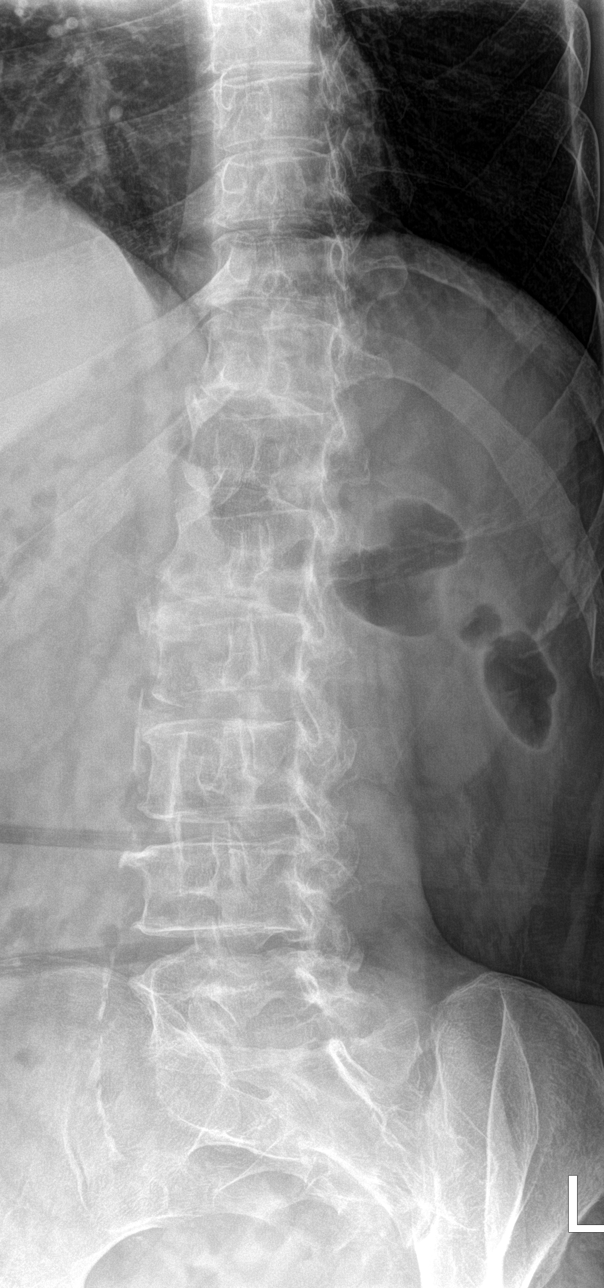

[l-spine obl (2 of 2)]
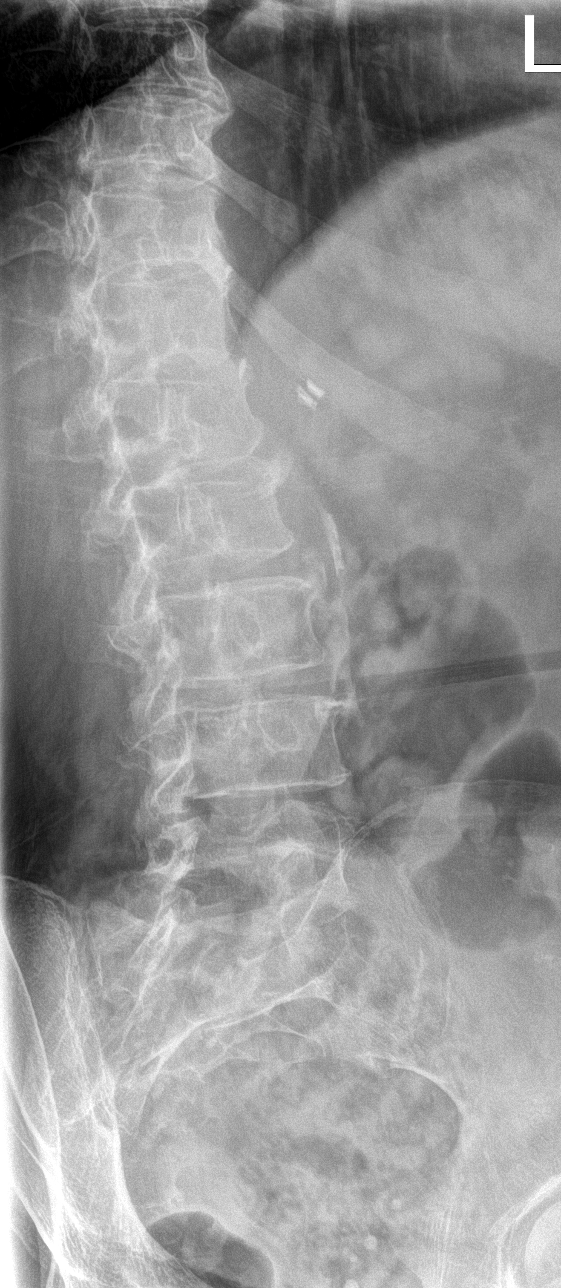

[l-spine lat]
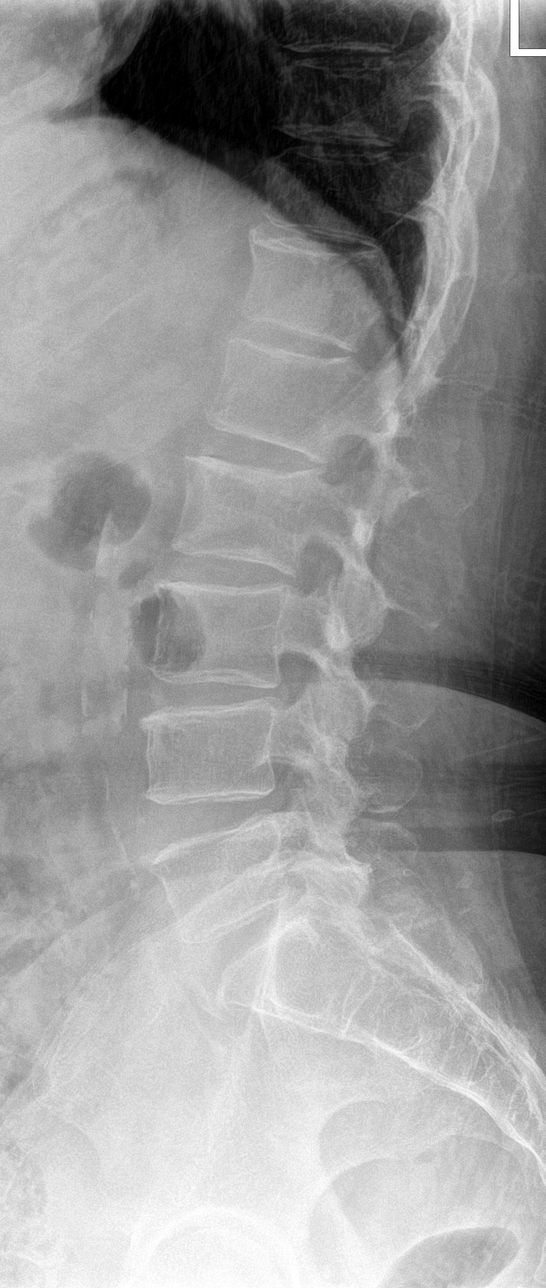

[l-spine spot]
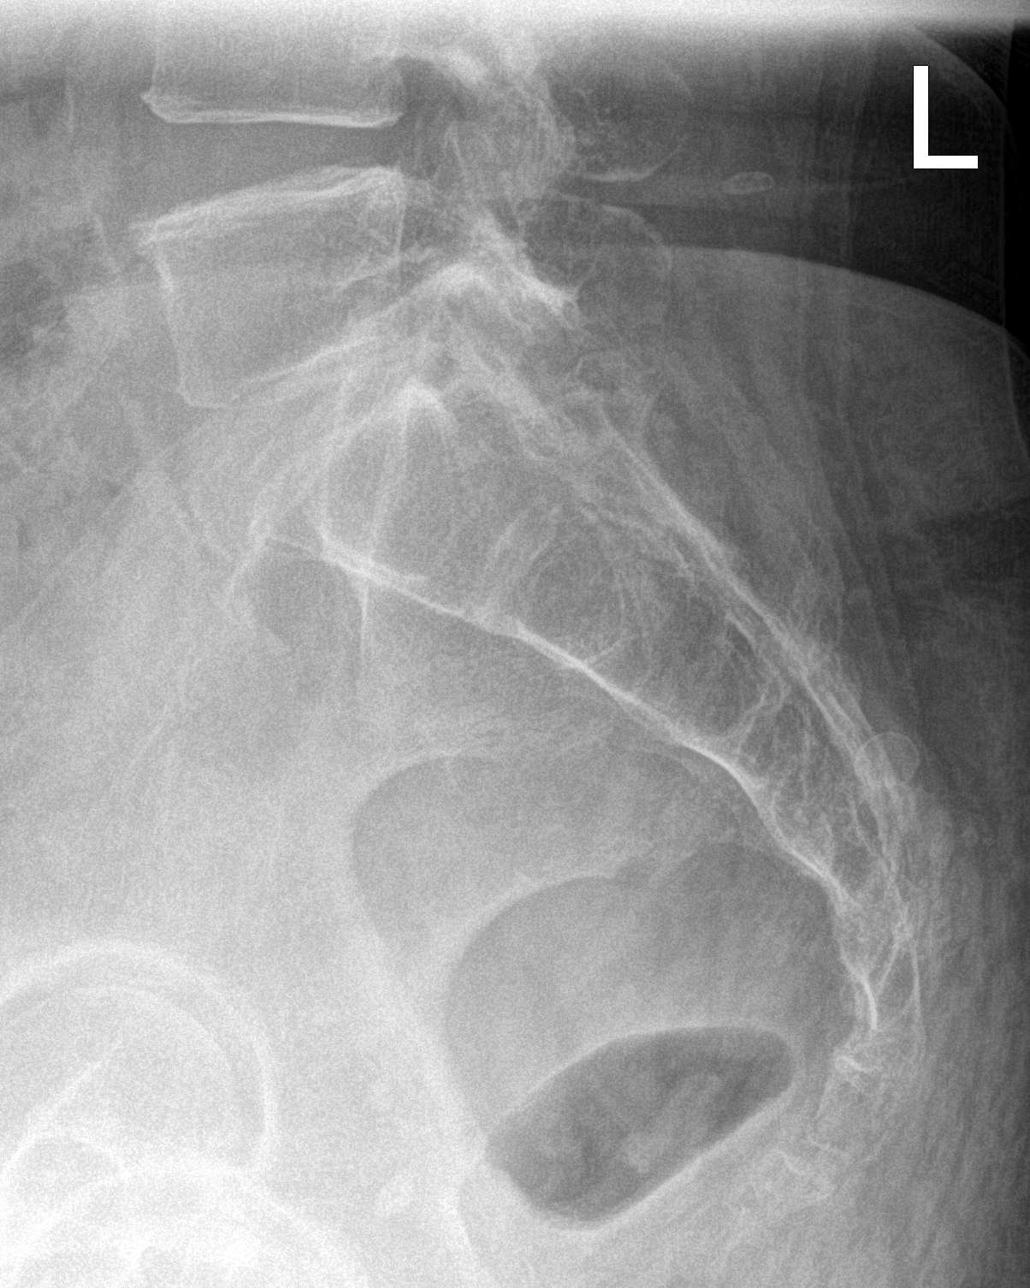

[5 of 5 positions shown; findings below may reference images not displayed]

FINDINGS: No evidence of acute fracture or malalignment. Vertebral body
heights and intervertebral disc spaces are maintained. Minimal
degenerative change for age. The bones are slightly osteopenic.
Atherosclerotic calcifications present throughout the abdominal
aorta.
IMPRESSION: 1. Negative.
2. Aortic atherosclerosis incidentally noted.

## 2016-09-11 DEATH — deceased
